# Patient Record
Sex: Male | Born: 1995 | Race: Black or African American | Hispanic: No | Marital: Single | Smoking: Former smoker
Health system: Southern US, Community
[De-identification: ages and names within clinical notes are randomized; demographics above are authoritative.]

---
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
## 2016-03-19 ENCOUNTER — Encounter (HOSPITAL_COMMUNITY): Payer: Self-pay

## 2016-03-19 ENCOUNTER — Emergency Department (HOSPITAL_COMMUNITY): Payer: Self-pay | Admitting: Anesthesiology

## 2016-03-19 ENCOUNTER — Encounter (HOSPITAL_COMMUNITY)
Admission: EM | Disposition: A | Discharge status: Home / Self Care | Payer: Self-pay | Source: Home / Self Care | Attending: Emergency Medicine

## 2016-03-19 ENCOUNTER — Ambulatory Visit (HOSPITAL_COMMUNITY)
Admission: EM | Admit: 2016-03-19 | Discharge: 2016-03-19 | Disposition: A | Discharge status: Home / Self Care | Payer: Self-pay | Attending: Emergency Medicine | Admitting: Emergency Medicine

## 2016-03-19 ENCOUNTER — Emergency Department (HOSPITAL_COMMUNITY): Payer: Self-pay

## 2016-03-19 DIAGNOSIS — N50819 Testicular pain, unspecified: Secondary | ICD-10-CM

## 2016-03-19 DIAGNOSIS — N4400 Torsion of testis, unspecified: Secondary | ICD-10-CM | POA: Insufficient documentation

## 2016-03-19 DIAGNOSIS — F17200 Nicotine dependence, unspecified, uncomplicated: Secondary | ICD-10-CM | POA: Insufficient documentation

## 2016-03-19 HISTORY — PX: ORCHIOPEXY: SHX479

## 2016-03-19 LAB — COMPREHENSIVE METABOLIC PANEL
ALBUMIN: 5.3 g/dL — AB (ref 3.5–5.0)
ALT: 19 U/L (ref 17–63)
ANION GAP: 10 (ref 5–15)
AST: 26 U/L (ref 15–41)
Alkaline Phosphatase: 44 U/L (ref 38–126)
BUN: 12 mg/dL (ref 6–20)
CO2: 24 mmol/L (ref 22–32)
Calcium: 9.8 mg/dL (ref 8.9–10.3)
Chloride: 106 mmol/L (ref 101–111)
Creatinine, Ser: 0.94 mg/dL (ref 0.61–1.24)
GFR calc Af Amer: >60 mL/min (ref >60)
GFR calc non Af Amer: >60 mL/min (ref >60)
GLUCOSE: 102 mg/dL — AB (ref 65–99)
POTASSIUM: 3.8 mmol/L (ref 3.5–5.1)
SODIUM: 140 mmol/L (ref 135–145)
Total Bilirubin: 1 mg/dL (ref 0.3–1.2)
Total Protein: 8.4 g/dL — ABNORMAL HIGH (ref 6.5–8.1)

## 2016-03-19 LAB — CBC
HEMATOCRIT: 44.8 % (ref 39.0–52.0)
HEMOGLOBIN: 16.1 g/dL (ref 13.0–17.0)
MCH: 30.7 pg (ref 26.0–34.0)
MCHC: 35.9 g/dL (ref 30.0–36.0)
MCV: 85.3 fL (ref 78.0–100.0)
Platelets: 182 10*3/uL (ref 150–400)
RBC: 5.25 MIL/uL (ref 4.22–5.81)
RDW: 12.2 % (ref 11.5–15.5)
WBC: 12.2 10*3/uL — ABNORMAL HIGH (ref 4.0–10.5)

## 2016-03-19 LAB — LIPASE, BLOOD: LIPASE: 18 U/L (ref 11–51)

## 2016-03-19 SURGERY — ORCHIOPEXY ADULT
Anesthesia: Choice | Laterality: Bilateral

## 2016-03-19 SURGERY — ORCHIOPEXY ADULT
Anesthesia: General | Site: Scrotum

## 2016-03-19 MED ORDER — FENTANYL CITRATE (PF) 100 MCG/2ML IJ SOLN
INTRAMUSCULAR | Status: AC
  Filled 2016-03-19: qty 2

## 2016-03-19 MED ORDER — MIDAZOLAM HCL 5 MG/5ML IJ SOLN
INTRAMUSCULAR | Status: DC | PRN
  Administered 2016-03-19: 2 mg via INTRAVENOUS

## 2016-03-19 MED ORDER — LIDOCAINE 2% (20 MG/ML) 5 ML SYRINGE
INTRAMUSCULAR | Status: DC | PRN
  Administered 2016-03-19: 50 mg via INTRAVENOUS

## 2016-03-19 MED ORDER — ONDANSETRON HCL 4 MG/2ML IJ SOLN
INTRAMUSCULAR | Status: AC
  Filled 2016-03-19: qty 2

## 2016-03-19 MED ORDER — ONDANSETRON HCL 4 MG/2ML IJ SOLN
INTRAMUSCULAR | Status: DC | PRN
Start: 2016-03-19 — End: 2016-03-19
  Administered 2016-03-19: 4 mg via INTRAVENOUS

## 2016-03-19 MED ORDER — 0.9 % SODIUM CHLORIDE (POUR BTL) OPTIME
TOPICAL | Status: DC | PRN
  Administered 2016-03-19: 1000 mL

## 2016-03-19 MED ORDER — NALOXONE HCL 0.4 MG/ML IJ SOLN
INTRAMUSCULAR | Status: AC
  Filled 2016-03-19: qty 1

## 2016-03-19 MED ORDER — ONDANSETRON 4 MG PO TBDP
4.0000 mg | ORAL_TABLET | Freq: Once | ORAL | Status: AC | PRN
  Administered 2016-03-19: 4 mg via ORAL
  Filled 2016-03-19: qty 1

## 2016-03-19 MED ORDER — BUPIVACAINE HCL (PF) 0.25 % IJ SOLN
INTRAMUSCULAR | Status: AC
  Filled 2016-03-19: qty 30

## 2016-03-19 MED ORDER — SODIUM CHLORIDE 0.9 % IV SOLN: 250.0000 mL | INTRAVENOUS | Status: DC | PRN

## 2016-03-19 MED ORDER — ONDANSETRON HCL 4 MG/2ML IJ SOLN
4.0000 mg | Freq: Once | INTRAMUSCULAR | Status: AC | PRN
  Administered 2016-03-19: 4 mg via INTRAVENOUS

## 2016-03-19 MED ORDER — ACETAMINOPHEN 650 MG RE SUPP
650.0000 mg | RECTAL | Status: DC | PRN
  Filled 2016-03-19: qty 1

## 2016-03-19 MED ORDER — BUPIVACAINE HCL (PF) 0.25 % IJ SOLN
INTRAMUSCULAR | Status: DC | PRN
  Administered 2016-03-19: 16 mL

## 2016-03-19 MED ORDER — SUCCINYLCHOLINE CHLORIDE 200 MG/10ML IV SOSY
PREFILLED_SYRINGE | INTRAVENOUS | Status: DC | PRN
  Administered 2016-03-19: 100 mg via INTRAVENOUS

## 2016-03-19 MED ORDER — SODIUM CHLORIDE 0.9% FLUSH: 3.0000 mL | Freq: Two times a day (BID) | INTRAVENOUS | Status: DC

## 2016-03-19 MED ORDER — CEFAZOLIN SODIUM-DEXTROSE 2-4 GM/100ML-% IV SOLN: 2.0000 g | INTRAVENOUS | Status: DC

## 2016-03-19 MED ORDER — HYDROMORPHONE HCL 1 MG/ML IJ SOLN: 0.2500 mg | INTRAMUSCULAR | Status: DC | PRN

## 2016-03-19 MED ORDER — ACETAMINOPHEN 325 MG PO TABS: 650.0000 mg | ORAL_TABLET | ORAL | Status: DC | PRN

## 2016-03-19 MED ORDER — CEFAZOLIN SODIUM-DEXTROSE 2-4 GM/100ML-% IV SOLN
INTRAVENOUS | Status: AC
  Filled 2016-03-19: qty 100

## 2016-03-19 MED ORDER — PROPOFOL 10 MG/ML IV BOLUS
INTRAVENOUS | Status: AC
  Filled 2016-03-19: qty 20

## 2016-03-19 MED ORDER — NALOXONE HCL 0.4 MG/ML IJ SOLN
INTRAMUSCULAR | Status: DC | PRN
  Administered 2016-03-19 (×2): .1 ug via INTRAVENOUS

## 2016-03-19 MED ORDER — OXYCODONE HCL 5 MG PO TABS: 5.0000 mg | ORAL_TABLET | ORAL | Status: DC | PRN

## 2016-03-19 MED ORDER — MORPHINE SULFATE (PF) 2 MG/ML IV SOLN
4.0000 mg | Freq: Once | INTRAVENOUS | Status: AC
  Administered 2016-03-19: 4 mg via INTRAVENOUS
  Filled 2016-03-19: qty 2

## 2016-03-19 MED ORDER — MEPERIDINE HCL 50 MG/ML IJ SOLN: 6.2500 mg | INTRAMUSCULAR | Status: DC | PRN

## 2016-03-19 MED ORDER — SODIUM CHLORIDE 0.9% FLUSH: 3.0000 mL | INTRAVENOUS | Status: DC | PRN

## 2016-03-19 MED ORDER — LIDOCAINE 2% (20 MG/ML) 5 ML SYRINGE
INTRAMUSCULAR | Status: AC
  Filled 2016-03-19: qty 5

## 2016-03-19 MED ORDER — LACTATED RINGERS IV SOLN
INTRAVENOUS | Status: DC | PRN
  Administered 2016-03-19: 21:00:00 via INTRAVENOUS

## 2016-03-19 MED ORDER — LACTATED RINGERS IV SOLN: INTRAVENOUS | Status: DC

## 2016-03-19 MED ORDER — CEFAZOLIN SODIUM-DEXTROSE 2-3 GM-% IV SOLR
INTRAVENOUS | Status: DC | PRN
  Administered 2016-03-19: 2 g via INTRAVENOUS

## 2016-03-19 MED ORDER — PROPOFOL 10 MG/ML IV BOLUS
INTRAVENOUS | Status: DC | PRN
Start: 2016-03-19 — End: 2016-03-19
  Administered 2016-03-19: 150 mg via INTRAVENOUS

## 2016-03-19 MED ORDER — MIDAZOLAM HCL 2 MG/2ML IJ SOLN
INTRAMUSCULAR | Status: AC
  Filled 2016-03-19: qty 2

## 2016-03-19 MED ORDER — HYDROCODONE-ACETAMINOPHEN 5-325 MG PO TABS: 1.0000 | ORAL_TABLET | Freq: Four times a day (QID) | ORAL | 0 refills | Status: AC | PRN

## 2016-03-19 MED ORDER — ONDANSETRON HCL 4 MG/2ML IJ SOLN: 4.0000 mg | Freq: Once | INTRAMUSCULAR | Status: DC | PRN

## 2016-03-19 MED ORDER — FENTANYL CITRATE (PF) 100 MCG/2ML IJ SOLN
INTRAMUSCULAR | Status: DC | PRN
  Administered 2016-03-19: 25 ug via INTRAVENOUS
  Administered 2016-03-19: 100 ug via INTRAVENOUS

## 2016-03-19 MED ORDER — FENTANYL CITRATE (PF) 100 MCG/2ML IJ SOLN: 25.0000 ug | INTRAMUSCULAR | Status: DC | PRN

## 2016-03-19 MED ORDER — ONDANSETRON HCL 4 MG/2ML IJ SOLN
4.0000 mg | Freq: Once | INTRAMUSCULAR | Status: AC
  Administered 2016-03-19: 4 mg via INTRAVENOUS
  Filled 2016-03-19: qty 2

## 2016-03-19 SURGICAL SUPPLY — 30 items
BLADE HEX COATED 2.75 (ELECTRODE) ×4 IMPLANT
BLADE SURG 15 STRL LF DISP TIS (BLADE) ×2 IMPLANT
BLADE SURG 15 STRL SS (BLADE) ×2
CATH URET 5FR 28IN OPEN ENDED (CATHETERS) IMPLANT
DERMABOND ADVANCED (GAUZE/BANDAGES/DRESSINGS) ×2
DERMABOND ADVANCED .7 DNX12 (GAUZE/BANDAGES/DRESSINGS) ×2 IMPLANT
DRAIN PENROSE 18X1/2 LTX STRL (DRAIN) ×4 IMPLANT
DRSG KUZMA FLUFF (GAUZE/BANDAGES/DRESSINGS) ×4 IMPLANT
ELECT PENCIL ROCKER SW 15FT (MISCELLANEOUS) ×4 IMPLANT
ELECT REM PT RETURN 9FT ADLT (ELECTROSURGICAL) ×4
ELECTRODE REM PT RTRN 9FT ADLT (ELECTROSURGICAL) ×2 IMPLANT
GLOVE BIOGEL PI IND STRL 6.5 (GLOVE) ×2 IMPLANT
GLOVE BIOGEL PI INDICATOR 6.5 (GLOVE) ×2
GLOVE SURG SS PI 6.5 STRL IVOR (GLOVE) ×4 IMPLANT
GLOVE SURG SS PI 8.0 STRL IVOR (GLOVE) ×4 IMPLANT
GOWN STRL REUS W/ TWL LRG LVL3 (GOWN DISPOSABLE) ×2 IMPLANT
GOWN STRL REUS W/TWL LRG LVL3 (GOWN DISPOSABLE) ×2
GOWN STRL REUS W/TWL XL LVL3 (GOWN DISPOSABLE) ×4 IMPLANT
KIT BASIN OR (CUSTOM PROCEDURE TRAY) ×4 IMPLANT
NEEDLE HYPO 22GX1.5 SAFETY (NEEDLE) ×4 IMPLANT
PACK BASIC VI WITH GOWN DISP (CUSTOM PROCEDURE TRAY) ×4 IMPLANT
SPONGE LAP 4X18 X RAY DECT (DISPOSABLE) ×8 IMPLANT
SUPPORTER ATHLETIC SM (MISCELLANEOUS) ×4 IMPLANT
SUT CHROMIC 3 0 SH 27 (SUTURE) ×8 IMPLANT
SUT CHROMIC 4 0 SH 27 (SUTURE) IMPLANT
SUT SILK 3 0 SH CR/8 (SUTURE) ×4 IMPLANT
SUT VIC AB 3-0 SH 27 (SUTURE) ×2
SUT VIC AB 3-0 SH 27XBRD (SUTURE) ×2 IMPLANT
SUT VICRYL 0 TIES 12 18 (SUTURE) ×4 IMPLANT
SYR CONTROL 10ML LL (SYRINGE) ×4 IMPLANT

## 2016-03-19 NOTE — ED Notes (Signed)
US at bedside

## 2016-03-19 NOTE — Anesthesia Procedure Notes (Signed)
Procedure Name: Intubation Date/Time: 03/19/2016 9:06 PM Performed by: Paris LoreBLANTON, Dhalia Zingaro M Pre-anesthesia Checklist: Patient identified, Emergency Drugs available, Suction available, Patient being monitored and Timeout performed Patient Re-evaluated:Patient Re-evaluated prior to inductionOxygen Delivery Method: Circle system utilized Preoxygenation: Pre-oxygenation with 100% oxygen Intubation Type: IV induction, Cricoid Pressure applied and Rapid sequence Laryngoscope Size: Mac and 4 Grade View: Grade I Tube type: Oral Tube size: 7.5 mm Number of attempts: 1 Airway Equipment and Method: Stylet Placement Confirmation: ETT inserted through vocal cords under direct vision,  positive ETCO2,  CO2 detector and breath sounds checked- equal and bilateral Secured at: 21 cm Tube secured with: Tape Dental Injury: Teeth and Oropharynx as per pre-operative assessment

## 2016-03-19 NOTE — Consult Note (Signed)
Subjective: CC: left testicular pain.  Hx: I was asked to see Jeremy Adkins in consultation by Dr. Preston FleetingGlick for left testicular torsion.  Jeremy Adkins had the onset of left testicular pain at 11am.  The pain has been severe with nausea and vomiting.   Jeremy Adkins has not voided in the ER but has no voiding complaints.   US confirms torsion.   Jeremy Adkins has no other GU history and no associated signs or symptoms.  ROS:  Review of Systems  Constitutional: Negative for chills and fever.  Gastrointestinal: Positive for nausea and vomiting.  All other systems reviewed and are negative.   No Known Allergies  History reviewed. No pertinent past medical history.  History reviewed. No pertinent surgical history.  Social History   Social History  . Marital status: Single    Spouse name: N/A  . Number of children: N/A  . Years of education: N/A   Occupational History  . Not on file.   Social History Main Topics  . Smoking status: Current Some Day Smoker  . Smokeless tobacco: Never Used  . Alcohol use Yes     Comment: occ  . Drug use:     Types: Marijuana  . Sexual activity: No   Other Topics Concern  . Not on file   Social History Narrative  . No narrative on file    History reviewed. No pertinent family history.  Anti-infectives: Anti-infectives    None      No current facility-administered medications for this encounter.    Current Outpatient Prescriptions  Medication Sig Dispense Refill  . calcium carbonate (TUMS - DOSED IN MG ELEMENTAL CALCIUM) 500 MG chewable tablet Chew 2 tablets by mouth 3 (three) times daily as needed for indigestion.     Marland Kitchen. ibuprofen (ADVIL,MOTRIN) 200 MG tablet Take 400 mg by mouth every 6 (six) hours as needed. For pain     Past med, surg,fam and social history reviewed.   Objective: Vital signs in last 24 hours: Temp:  [97.6 F (36.4 C)] 97.6 F (36.4 C) (11/03 1718) Pulse Rate:  [51] 51 (11/03 1718) Resp:  [18] 18 (11/03 1718) BP: (115)/(90) 115/90 (11/03  1718) SpO2:  [98 %] 98 % (11/03 1718)  Intake/Output from previous day: No intake/output data recorded. Intake/Output this shift: No intake/output data recorded.   Physical Exam  Constitutional: Jeremy Adkins is oriented to person, place, and time and well-developed, well-nourished, and in no distress.  HENT:  Head: Normocephalic and atraumatic.  Neck: Normal range of motion. Neck supple. No thyromegaly present.  Cardiovascular: Normal rate, regular rhythm and normal heart sounds.   Pulmonary/Chest: Effort normal and breath sounds normal. No respiratory distress.  Abdominal: Soft. Jeremy Adkins exhibits no distension and no mass. There is no tenderness.  Genitourinary:  Genitourinary Comments: Nl penis with adequate meatus.  Scrotum with normal skin without edema.  Left testicle is high riding and tender without swelling.  Right testicle is normal.     Epididymii are unremarkable.   Musculoskeletal: Normal range of motion. Jeremy Adkins exhibits no edema or tenderness.  Lymphadenopathy:    Jeremy Adkins has no cervical adenopathy.  Neurological: Jeremy Adkins is alert and oriented to person, place, and time.  Skin: Skin is warm and dry.  Psychiatric: Mood and affect normal.  Vitals reviewed.   Lab Results:   Recent Labs  03/19/16 1906  WBC 12.2*  HGB 16.1  HCT 44.8  PLT 182   BMET  Recent Labs  03/19/16 1906  NA 140  K 3.8  CL  106  CO2 24  GLUCOSE 102*  BUN 12  CREATININE 0.94  CALCIUM 9.8   PT/INR No results for input(s): LABPROT, INR in the last 72 hours. ABG No results for input(s): PHART, HCO3 in the last 72 hours.  Invalid input(s): PCO2, PO2  Studies/Results: US Scrotum  Result Date: 03/19/2016 CLINICAL DATA:  Acute onset left testicular pain several hours ago. EXAM: SCROTAL ULTRASOUND DOPPLER ULTRASOUND OF THE TESTICLES TECHNIQUE: Complete ultrasound examination of the testicles, epididymis, and other scrotal structures was performed. Color and spectral Doppler ultrasound were also utilized to  evaluate blood flow to the testicles. COMPARISON:  None. FINDINGS: Right testicle Measurements: 4.5 x 2.5 x 3.0 cm. No mass or microlithiasis visualized. Internal blood flow seen on color Doppler ultrasound. Left testicle Measurements: 4.2 x 2.5 x 3.7 cm. Diffusely heterogeneous decreased echotexture, without evidence of focal mass. Absence of internal blood flow seen on color Doppler ultrasound. Right epididymis:  Normal in size and appearance. Left epididymis: Enlarged left epididymis with mild hyperemia on color Doppler ultrasound. Hydrocele:  None visualized. Varicocele:  None visualized. Pulsed Doppler interrogation of both testes demonstrates normal low resistance arterial and venous waveforms within the right testicle, however no arterial or venous waveforms could be obtained from the right testicle. IMPRESSION: Absence of blood flow in diffusely decreased heterogeneous echotexture of left testicle, consistent with left testicular torsion. Critical Value/emergent results were called by telephone at the time of interpretation on 03/19/2016 at 8:13 pm to Dr. Preston Fleeting in the ED, who verbally acknowledged these results. Electronically Signed   By: Myles Rosenthal M.D.   On: 03/19/2016 20:15   [place] Art/ven Flow Abd Pelv Doppler  Result Date: 03/19/2016 CLINICAL DATA:  Acute onset left testicular pain several hours ago. EXAM: SCROTAL ULTRASOUND DOPPLER ULTRASOUND OF THE TESTICLES TECHNIQUE: Complete ultrasound examination of the testicles, epididymis, and other scrotal structures was performed. Color and spectral Doppler ultrasound were also utilized to evaluate blood flow to the testicles. COMPARISON:  None. FINDINGS: Right testicle Measurements: 4.5 x 2.5 x 3.0 cm. No mass or microlithiasis visualized. Internal blood flow seen on color Doppler ultrasound. Left testicle Measurements: 4.2 x 2.5 x 3.7 cm. Diffusely heterogeneous decreased echotexture, without evidence of focal mass. Absence of internal blood flow seen on  color Doppler ultrasound. Right epididymis:  Normal in size and appearance. Left epididymis: Enlarged left epididymis with mild hyperemia on color Doppler ultrasound. Hydrocele:  None visualized. Varicocele:  None visualized. Pulsed Doppler interrogation of both testes demonstrates normal low resistance arterial and venous waveforms within the right testicle, however no arterial or venous waveforms could be obtained from the right testicle. IMPRESSION: Absence of blood flow in diffusely decreased heterogeneous echotexture of left testicle, consistent with left testicular torsion. Critical Value/emergent results were called by telephone at the time of interpretation on 03/19/2016 at 8:13 pm to Dr. Preston Fleeting in the ED, who verbally acknowledged these results. Electronically Signed   By: Myles Rosenthal M.D.   On: 03/19/2016 20:15   Labs and [place] report reviewed.   I discussed the case with Chase Picket PA for Dr. Preston Fleeting    Assessment: Left testicular torsion 9-10 hours in duration.  I am going to take him to the OR emergently for scrotal exploration with left orchiopexy vs orchiectomy and right orchiectomy.  I have reviewed the risks of bleeding, infection, loss of the testicle, atrophy of the testicle, impaired fertility, impaired hormone production, wound complications, pain, thrombotic events and anesthetic complications.      CC:  Dr. Dione Boozeavid Glick.      Grant Henkes J 03/19/2016 [phone]

## 2016-03-19 NOTE — ED Triage Notes (Signed)
Pt presents from home c/o N/V and chills that started at 10AM this morning. PT also endorses dizziness and congestion. Pt unable to tolerate PO. Ambulatory. A&Ox4.

## 2016-03-19 NOTE — Brief Op Note (Signed)
03/19/2016  9:41 PM  PATIENT:  Jeremy Adkins  20 y.o. male  PRE-OPERATIVE DIAGNOSIS:  testicular torsion  POST-OPERATIVE DIAGNOSIS:  testicular torsion  PROCEDURE:  Procedure(s): SCROTAL EXPLORATION, LEFT ORCHIOPEXY  AND RIGHT ORCHIOPEXY (N/A)  SURGEON:  Surgeon(s) and Role:    * Bjorn PippinJohn Damika Harmon, MD - Primary  PHYSICIAN ASSISTANT:   ASSISTANTS: none   ANESTHESIA:   general  EBL:  Total I/O In: -  Out: 10 [Blood:10]  BLOOD ADMINISTERED:none  DRAINS: none   LOCAL MEDICATIONS USED:  MARCAINE    and Amount: 12 ml 0.25%  SPECIMEN:  No Specimen  DISPOSITION OF SPECIMEN:  N/A  COUNTS:  YES  TOURNIQUET:  * No tourniquets in log *  DICTATION: .Other Dictation: Dictation Number [id number]  PLAN OF CARE: Discharge to home after PACU  PATIENT DISPOSITION:  PACU - hemodynamically stable.   Delay start of Pharmacological VTE agent (>24hrs) due to surgical blood loss or risk of bleeding: not applicable

## 2016-03-19 NOTE — ED Notes (Signed)
Patient taken to OR.

## 2016-03-19 NOTE — Discharge Instructions (Addendum)
HOME CARE INSTRUCTIONS FOR SCROTAL PROCEDURES  Wound Care & Hygiene: You may apply an ice bag to the scrotum for the first 24 hours.  This may help decrease swelling and soreness.  You may have a dressing held in place by an athletic supporter.  You may remove the dressing in 24 hours and shower in 48 hours.  Continue to use the athletic supporter or tight briefs for at least a week. Activity: Rest today - not necessarily flat bed rest.  Just take it easy.  You should not do strenuous activities until your follow-up visit with your doctor.  You may resume light activity in 48 hours.  Return to Work:  Your doctor will advise you of this depending on the type of work you do  Diet: Drink liquids or eat a light diet this evening.  You may resume a regular diet tomorrow.  General Expectations: You may have a small amount of bleeding.  The scrotum may be swollen or bruised for about a week.  Call your Doctor if these occur:  -persistent or heavy bleeding  -temperature of 101 degrees or more  -severe pain, not relieved by your pain medication  [facility]  Call to set up and appointment. For 1-2 weeks.   [phone]  Patient Signature:  __________________________________________________  Nurse's Signature:  __________________________________________________ Testicular Torsion Testicular torsion is a twisting of the spermatic cord, artery, and vein that go to the testicle. This twisting cuts off the blood supply to everything in the sac that contains the testes, blood vessels, and part of the spermatic cord (scrotum). Testicular torsion is most commonly seen in newborn and adolescent males. It can also occur before birth. Testicular torsion requires emergency treatment. The testicle usually can be saved if the torsion is treated within 6 hours of onset. If the torsion is left untreated for too long, the testicle will die and have to be removed.  CAUSES  Torsion can be caused  by a hit on the scrotum or by certain movements during exercise. In some males, testicular torsion is more common because the connection of their testicle to a specific tissue in their scrotum developed in the wrong place, allowing the testicle to rotate and the cord to get twisted.  SIGNS AND SYMPTOMS  The main symptom of testicular torsion is pain in your testicle. The scrotum may be swollen, red, hard, and very tender. There will be excess fluid in the tissue (edema).The testicle may be higher than normal in the scrotum. The skin of the scrotum may be stuck to the testicle. You may have nausea, vomiting, and a fever. DIAGNOSIS  Often testicular torsion is diagnosed through a physical exam. Sometimes imaging exams and tests to measure blood flow may be done. TREATMENT  A manual untwisting of the testicle may be done when the testicle is still mobile and the maneuver is not too painful. However, surgery usually is necessary and should be done as soon as possible after torsion occurs. During surgery, the testicle is untwisted and evaluated and possibly removed.    This information is not intended to replace advice given to you by your health care provider. Make sure you discuss any questions you have with your health care provider.   Document Released: 05/03/2005 Document Revised: 05/08/2013 Document Reviewed: 10/23/2012 Elsevier Interactive Patient Education [employer]2016 [employer] Inc.

## 2016-03-19 NOTE — Anesthesia Postprocedure Evaluation (Signed)
Anesthesia Post Note  Patient: Jeremy Adkins  Procedure(s) Performed: Procedure(s) (LRB): SCROTAL EXPLORATION AND BILATERAL ORCHIDOPEXY (Bilateral)  Patient location during evaluation: PACU Anesthesia Type: General Level of consciousness: awake and alert Pain management: pain level controlled Vital Signs Assessment: post-procedure vital signs reviewed and stable Respiratory status: spontaneous breathing, nonlabored ventilation, respiratory function stable and patient connected to nasal cannula oxygen Cardiovascular status: blood pressure returned to baseline and stable Postop Assessment: no signs of nausea or vomiting Anesthetic complications: no    Last Vitals:  Vitals:   03/19/16 2221 03/19/16 2230  BP: 123/76 136/76  Pulse: (!) 43 (!) 42  Resp: (!) 21 16  Temp:      Last Pain:  Vitals:   03/19/16 2209  TempSrc:   PainSc: 0-No pain                 Iley Breeden DAVID

## 2016-03-19 NOTE — ED Notes (Signed)
US called and stated she is at [facility]: [facility].  Someone will be here at 1900 to perform [place]

## 2016-03-19 NOTE — Anesthesia Preprocedure Evaluation (Signed)
Anesthesia Evaluation  Patient identified by MRN, date of birth, ID band Patient awake    Reviewed: Allergy & Precautions, NPO status , Patient's Chart, lab work & pertinent test results  Airway Mallampati: I  TM Distance: >3 FB Neck ROM: Full    Dental   Pulmonary Current Smoker,    Pulmonary exam normal        Cardiovascular Normal cardiovascular exam     Neuro/Psych    GI/Hepatic   Endo/Other    Renal/GU      Musculoskeletal   Abdominal   Peds  Hematology   Anesthesia Other Findings   Reproductive/Obstetrics                             Anesthesia Physical Anesthesia Plan  ASA: II and emergent  Anesthesia Plan: General   Post-op Pain Management:    Induction: Intravenous, Rapid sequence and Cricoid pressure planned  Airway Management Planned: Oral ETT  Additional Equipment:   Intra-op Plan:   Post-operative Plan: Extubation in OR  Informed Consent: I have reviewed the patients History and Physical, chart, labs and discussed the procedure including the risks, benefits and alternatives for the proposed anesthesia with the patient or authorized representative who has indicated his/her understanding and acceptance.     Plan Discussed with: CRNA and Surgeon  Anesthesia Plan Comments:         Anesthesia Quick Evaluation  

## 2016-03-19 NOTE — ED Notes (Addendum)
Patient's mother approached me in lobby as I called a patient back from waiting room with concern that her son was in a lot of pain.  Upon reassessment, patient denies pain with abdominal palpation.  He states his left scrotum is hurting and began to hurt around 11 am today.  He did not mention that initially because he was embarassed to discuss it in front of his mother.  Patient was immediately roomed in [facility] and EDP Pilcher notified for concern for torsion.

## 2016-03-19 NOTE — ED Provider Notes (Signed)
[facility]-EMERGENCY DEPT Provider Note   CSN: [id number] Arrival date & time: 03/19/16  1712     History   Chief Complaint Chief Complaint  Patient presents with  . Emesis  . Dizziness    HPI Jeremy Adkins is a 20 y.o. male.  The history is provided by the patient and medical records. No language interpreter was used.  Emesis   Pertinent negatives include no abdominal pain, no fever and no headaches.  Dizziness  Associated symptoms: nausea and vomiting   Associated symptoms: no blood in stool, no chest pain, no headaches and no shortness of breath    Jeremy Adkins is an otherwise healthy 20 y.o. male  who presents to the Emergency Department complaining of acute onset of left testicular pain beginning approximately 11:30 this morning. Associated symptoms include testicular swelling and tenderness. Patient states immediately following pain, he became nauseous and has had multiple episodes of emesis. He denies abdominal pain, fever, penile discharge/pain, dysuria, back pain, chest pain, shortness of breath. Mother states she tried to give him Pepto-Bismol because he told her his stomach was upset, but he immediately threw this up. No medications taken prior to arrival for pain. Last time he ate or drank anything other than Pepto-Bismol was last night.  History reviewed. No pertinent past medical history.  There are no active problems to display for this patient.   History reviewed. No pertinent surgical history.     Home Medications    Prior to Admission medications   Medication Sig Start Date End Date Taking? Authorizing Provider  calcium carbonate (TUMS - DOSED IN MG ELEMENTAL CALCIUM) 500 MG chewable tablet Chew 2 tablets by mouth 3 (three) times daily as needed for indigestion.    Yes Historical Provider, MD  ibuprofen (ADVIL,MOTRIN) 200 MG tablet Take 400 mg by mouth every 6 (six) hours as needed. For pain   Yes Historical Provider, MD    Family History History reviewed.  No pertinent family history.  Social History Social History  Substance Use Topics  . Smoking status: Current Some Day Smoker  . Smokeless tobacco: Never Used  . Alcohol use Yes     Comment: occ     Allergies   Review of patient's allergies indicates no known allergies.   Review of Systems Review of Systems  Constitutional: Negative for fever.  HENT: Negative for congestion.   Respiratory: Negative for shortness of breath.   Cardiovascular: Negative for chest pain.  Gastrointestinal: Positive for nausea and vomiting. Negative for abdominal pain and blood in stool.  Genitourinary: Positive for scrotal swelling and testicular pain. Negative for discharge, dysuria, penile pain and penile swelling.  Musculoskeletal: Negative for back pain.  Skin: Negative for color change.  Neurological: Negative for headaches.  Hematological: Does not bruise/bleed easily.     Physical Exam Updated Vital Signs BP 115/90 (BP Location: Right Arm)   Pulse (!) 51   Temp 97.6 F (36.4 C) (Oral)   Resp 18   SpO2 98%   Physical Exam  Constitutional: He is oriented to person, place, and time. He appears well-developed and well-nourished. No distress.  HENT:  Head: Normocephalic and atraumatic.  Cardiovascular:  No murmur heard. Bradycardic but regular.  Pulmonary/Chest: Effort normal and breath sounds normal. No respiratory distress.  Abdominal: Soft. Bowel sounds are normal. He exhibits no distension. There is no tenderness.  Genitourinary:  Genitourinary Comments: Chaperone present for exam. High riding left testicle which is firm and very tender to the touch. No  erythema or warmth.   Neurological: He is alert and oriented to person, place, and time.  Skin: Skin is warm and dry.  Nursing note and vitals reviewed.   ED Treatments / Results  Labs (all labs ordered are listed, but only abnormal results are displayed) Labs Reviewed  COMPREHENSIVE METABOLIC PANEL - Abnormal; Notable for  the following:       Result Value   Glucose, Bld 102 (*)    Total Protein 8.4 (*)    Albumin 5.3 (*)    All other components within normal limits  CBC - Abnormal; Notable for the following:    WBC 12.2 (*)    All other components within normal limits  LIPASE, BLOOD  URINALYSIS, ROUTINE W REFLEX MICROSCOPIC ([facility])    EKG  EKG Interpretation None       Radiology Jeremy Adkins  Result Date: 03/19/2016 CLINICAL DATA:  Acute onset left testicular pain several hours ago. EXAM: SCROTAL ULTRASOUND DOPPLER ULTRASOUND OF THE TESTICLES TECHNIQUE: Complete ultrasound examination of the testicles, epididymis, and other scrotal structures was performed. Color and spectral Doppler ultrasound were also utilized to evaluate blood flow to the testicles. COMPARISON:  None. FINDINGS: Right testicle Measurements: 4.5 x 2.5 x 3.0 cm. No mass or microlithiasis visualized. Internal blood flow seen on color Doppler ultrasound. Left testicle Measurements: 4.2 x 2.5 x 3.7 cm. Diffusely heterogeneous decreased echotexture, without evidence of focal mass. Absence of internal blood flow seen on color Doppler ultrasound. Right epididymis:  Normal in size and appearance. Left epididymis: Enlarged left epididymis with mild hyperemia on color Doppler ultrasound. Hydrocele:  None visualized. Varicocele:  None visualized. Pulsed Doppler interrogation of both testes demonstrates normal low resistance arterial and venous waveforms within the right testicle, however no arterial or venous waveforms could be obtained from the right testicle. IMPRESSION: Absence of blood flow in diffusely decreased heterogeneous echotexture of left testicle, consistent with left testicular torsion. Critical Value/emergent results were called by telephone at the time of interpretation on 03/19/2016 at 8:13 pm to Dr. Preston FleetingGlick in the ED, who verbally acknowledged these results. Electronically Signed   By: Myles RosenthalJohn  Stahl M.D.   On: 03/19/2016 20:15   Jeremy  Art/ven Flow Abd Pelv Doppler  Result Date: 03/19/2016 CLINICAL DATA:  Acute onset left testicular pain several hours ago. EXAM: SCROTAL ULTRASOUND DOPPLER ULTRASOUND OF THE TESTICLES TECHNIQUE: Complete ultrasound examination of the testicles, epididymis, and other scrotal structures was performed. Color and spectral Doppler ultrasound were also utilized to evaluate blood flow to the testicles. COMPARISON:  None. FINDINGS: Right testicle Measurements: 4.5 x 2.5 x 3.0 cm. No mass or microlithiasis visualized. Internal blood flow seen on color Doppler ultrasound. Left testicle Measurements: 4.2 x 2.5 x 3.7 cm. Diffusely heterogeneous decreased echotexture, without evidence of focal mass. Absence of internal blood flow seen on color Doppler ultrasound. Right epididymis:  Normal in size and appearance. Left epididymis: Enlarged left epididymis with mild hyperemia on color Doppler ultrasound. Hydrocele:  None visualized. Varicocele:  None visualized. Pulsed Doppler interrogation of both testes demonstrates normal low resistance arterial and venous waveforms within the right testicle, however no arterial or venous waveforms could be obtained from the right testicle. IMPRESSION: Absence of blood flow in diffusely decreased heterogeneous echotexture of left testicle, consistent with left testicular torsion. Critical Value/emergent results were called by telephone at the time of interpretation on 03/19/2016 at 8:13 pm to Dr. Preston FleetingGlick in the ED, who verbally acknowledged these results. Electronically Signed   By: Jonny RuizJohn  Eppie Gibson M.D.   On: 03/19/2016 20:15    Procedures Procedures (including critical care time)  Medications Ordered in ED Medications  ondansetron (ZOFRAN-ODT) disintegrating tablet 4 mg (4 mg Oral Given 03/19/16 1732)  morphine 2 MG/ML injection 4 mg (4 mg Intravenous Given 03/19/16 1859)  ondansetron (ZOFRAN) injection 4 mg (4 mg Intravenous Given 03/19/16 1859)     Initial Impression / Assessment and  Plan / ED Course  I have reviewed the triage vital signs and the nursing notes.  Pertinent labs & imaging results that were available during my care of the patient were reviewed by me and considered in my medical decision making (see chart for details).  Clinical Course   Jeremy Adkins is a 20 y.o. male who presents to ED for acute onset of left testicular pain that began approximately 11:30 AM this morning. On exam, patient has a high riding, firm and tender left testicle concerning for torsion. Labs and ultrasound ordered. Ultrasound confirms absence of blood flow to the left testicle consistent with torsion.   8:08 PM - Consulted urology, Dr. Annabell Howells who will take patient to the operating room tonight.  Discussed the ultrasound findings with patient and informed him that urology will be seeing him shortly for procedure.   Patient seen by and discussed with Dr. Preston Fleeting who agrees with treatment plan.   Final Clinical Impressions(s) / ED Diagnoses   Final diagnoses:  Testicular pain  Testicular pain    New Prescriptions Current Discharge Medication List       [facility] Ward, PA-C 03/19/16 2116    Dione Booze, MD 03/20/16 [phone]

## 2016-03-19 NOTE — Transfer of Care (Signed)
Immediate Anesthesia Transfer of Care Note  Patient: Jeremy Adkins  Procedure(s) Performed: Procedure(s): SCROTAL EXPLORATION AND BILATERAL ORCHIDOPEXY (Bilateral)  Patient Location: PACU  Anesthesia Type:General  Level of Consciousness:  sedated, patient cooperative and responds to stimulation  Airway & Oxygen Therapy:Patient Spontanous Breathing and Patient connected to face mask oxgen  Post-op Assessment:  Report given to PACU RN and Post -op Vital signs reviewed and stable  Post vital signs:  Reviewed and stable  Last Vitals:  Vitals:   03/19/16 1718 03/19/16 2209  BP: 115/90   Pulse: (!) 51 (!) 51  Resp: 18 (!) 21  Temp: 36.4 C (P) 36.7 C    Complications: No apparent anesthesia complications

## 2016-03-20 NOTE — Op Note (Signed)
NAMCharmian Muff:  Blumer, Leverett                 ACCOUNT NO.:  [id number]  MEDICAL RECORD NO.:  [id number]  LOCATION:  [facility]                         [facility]  PHYSICIAN:  Excell SeltzerJohn J. Annabell HowellsWrenn, M.D.    DATE OF BIRTH:  1995-11-23  DATE OF PROCEDURE:  03/19/2016 DATE OF DISCHARGE:  03/19/2016                              OPERATIVE REPORT   PROCEDURE:  Scrotal exploration with bilateral orchidopexy.  PREOPERATIVE DIAGNOSIS:  Left testicular torsion.  POSTOPERATIVE DIAGNOSIS:  Left testicular torsion.  SURGEON:  Excell SeltzerJohn J. Annabell HowellsWrenn, M.D.  ANESTHESIA:  General.  SPECIMEN:  None.  DRAINS:  None.  BLOOD LOSS:  Minimal.  COMPLICATIONS:  None.  INDICATIONS:  Jeremy Adkins is a 20 year old male who had the onset at 11 o'clock today of severe left testicular pain with nausea and vomiting. He was delayed in get him to the emergency room, but once he arrived, an ultrasound demonstrated no blood flow to the left testicle consistent with testicular torsion.  He was brought urgently to the operating room for exploration, possibly the torsion with orchiopexy versus orchidectomy.  FINDINGS AND PROCEDURE:  He was given 2 g of Ancef, a general anesthetic was induced.  He was placed in the supine position.  His genitalia was clipped.  He was prepped with Betadine solution and draped in usual sterile fashion.  A midline anterior scrotal incision was made and the left testicle was delivered from the wound.  The tunica vaginalis was opened and the testicle was exposed.  The testicle was torsed.  It was detorsed easily and laid to the side.  I then delivered the right testicle in identical fashion.  This testicle had a very redundant appendix epididymis, which was removed with great care using the Bovie.  This testicle was then pexed into a dartos pouch using triangulated 3-0 chromic sutures.  I then looked back at the left testicle, it had pinked up nicely.  There were still some dusky areas and it was a bit tight, but  it was felt that orchiectomy was not indicated.  The left testicle was then pexed into subdartos pouch using triangulated 3-0 silk sutures.  Cord blocks with 0.25% Marcaine were performed and eventually, the skin incision was infiltrated as well.  Total of 12 mL of 0.25% Marcaine was used.  Once the testicles had been pexed and hemostasis was assured, the dartos was closed using a running 3-0 chromic suture with care being taken to include the median raphe.  The skin was then closed using running vertical mattress suture.  The wound was then reinforced using Dermabond.  After the Dermabond dried, the surrounding structures had been cleansed, a dressing of 4x4s, fluff, Kerlix and a scrotal support was applied.  The patient's anesthetic was reversed.  He was moved to the recovery room in stable condition.  There were no complications.  He will be discharged home with prescription for pain medication.     Excell SeltzerJohn J. Annabell HowellsWrenn, M.D.     JJW/MEDQ  D:  03/19/2016  T:  03/20/2016  Job:  [id number]

## 2016-03-22 ENCOUNTER — Encounter (HOSPITAL_COMMUNITY): Payer: Self-pay | Admitting: Urology

## 2016-12-16 ENCOUNTER — Encounter (HOSPITAL_COMMUNITY): Payer: Self-pay | Admitting: Emergency Medicine

## 2016-12-16 ENCOUNTER — Emergency Department (HOSPITAL_COMMUNITY)
Admission: EM | Admit: 2016-12-16 | Discharge: 2016-12-16 | Disposition: A | Discharge status: Home / Self Care | Payer: Self-pay | Attending: Emergency Medicine | Admitting: Emergency Medicine

## 2016-12-16 DIAGNOSIS — X58XXXA Exposure to other specified factors, initial encounter: Secondary | ICD-10-CM | POA: Insufficient documentation

## 2016-12-16 DIAGNOSIS — F17210 Nicotine dependence, cigarettes, uncomplicated: Secondary | ICD-10-CM | POA: Insufficient documentation

## 2016-12-16 DIAGNOSIS — Y929 Unspecified place or not applicable: Secondary | ICD-10-CM | POA: Insufficient documentation

## 2016-12-16 DIAGNOSIS — S30812A Abrasion of penis, initial encounter: Secondary | ICD-10-CM | POA: Insufficient documentation

## 2016-12-16 DIAGNOSIS — Z711 Person with feared health complaint in whom no diagnosis is made: Secondary | ICD-10-CM | POA: Insufficient documentation

## 2016-12-16 DIAGNOSIS — Y939 Activity, unspecified: Secondary | ICD-10-CM | POA: Insufficient documentation

## 2016-12-16 DIAGNOSIS — Y999 Unspecified external cause status: Secondary | ICD-10-CM | POA: Insufficient documentation

## 2016-12-16 MED ORDER — BACITRACIN ZINC 500 UNIT/GM EX OINT: 1.0000 "application " | TOPICAL_OINTMENT | Freq: Three times a day (TID) | CUTANEOUS | 1 refills | Status: AC

## 2016-12-16 NOTE — ED Notes (Signed)
Bed: WTR7 Expected date:  Expected time:  Means of arrival:  Comments: 

## 2016-12-16 NOTE — ED Provider Notes (Signed)
[facility]-EMERGENCY DEPT Provider Note   CSN: [id number] Arrival date & time: 12/16/16  1137  By signing my name below, I, Deland PrettySherilynn Knight, attest that this documentation has been prepared under the direction and in the presence of Everlene FarrierWilliam Lashayla Armes, PA-C. Electronically Signed: Deland PrettySherilynn Knight, ED Scribe. 12/16/16. 12:26 PM.  History   Chief Complaint Chief Complaint  Patient presents with  . Rash   The history is provided by the patient. No language interpreter was used.   HPI Comments: Jeremy Adkins is a 21 y.o. male who presents to the Emergency Department complaining of a gradually worsening, persistent "burning" penile pain that began 2 weeks ago. The pt states that he had unprotected sex once 2 weeks ago, and once last night with the same male partner. He endorses that this partner has not had similar symptoms including vaginal discharge and dysuria. He also endorses frequent dry masturbation. He states that masturbation, erections and engaging in sexual intercourse exacerbates his symptoms. The pt denies abdominal pain, dysuria,  penile discharge, and testicular pain.  History reviewed. No pertinent past medical history.  There are no active problems to display for this patient.   Past Surgical History:  Procedure Laterality Date  . ORCHIOPEXY Bilateral 03/19/2016   Procedure: SCROTAL EXPLORATION AND BILATERAL ORCHIDOPEXY;  Surgeon: Bjorn PippinJohn Wrenn, MD;  Location: [facility];  Service: Urology;  Laterality: Bilateral;       Home Medications    Prior to Admission medications   Medication Sig Start Date End Date Taking? Authorizing Provider  bacitracin ointment Apply 1 application topically 3 (three) times daily. 12/16/16   Everlene Farrieransie, Cindel Daugherty, PA-C  calcium carbonate (TUMS - DOSED IN MG ELEMENTAL CALCIUM) 500 MG chewable tablet Chew 2 tablets by mouth 3 (three) times daily as needed for indigestion.     [provider]  HYDROcodone-acetaminophen (NORCO) 5-325 MG tablet Take 1 tablet  by mouth every 6 (six) hours as needed for moderate pain. 03/19/16   Bjorn PippinWrenn, John, MD  ibuprofen (ADVIL,MOTRIN) 200 MG tablet Take 400 mg by mouth every 6 (six) hours as needed. For pain    [provider]    Family History History reviewed. No pertinent family history.  Social History Social History  Substance Use Topics  . Smoking status: Current Some Day Smoker  . Smokeless tobacco: Never Used  . Alcohol use Yes     Comment: occ     Allergies   Patient has no known allergies.   Review of Systems Review of Systems  Constitutional: Negative for chills and fever.  HENT: Negative for mouth sores.   Gastrointestinal: Negative for abdominal pain, nausea and vomiting.  Genitourinary: Positive for genital sores. Negative for decreased urine volume, difficulty urinating, discharge, dysuria, frequency, hematuria, penile pain, penile swelling, testicular pain and urgency.  Skin: Positive for rash.     Physical Exam Updated Vital Signs BP 136/71 (BP Location: Left Arm)   Pulse (!) 54   Temp 97.9 F (36.6 C) (Oral)   Resp 18   Ht 6' (1.829 m)   Wt 63.7 kg (140 lb 8 oz)   SpO2 100%   BMI 19.06 kg/m   Physical Exam  Constitutional: He appears well-developed and well-nourished. No distress.  Nontoxic appearing.  HENT:  Head: Normocephalic and atraumatic.  Mouth/Throat: Oropharynx is clear and moist.  Eyes: Right eye exhibits no discharge. Left eye exhibits no discharge.  Pulmonary/Chest: Effort normal. No respiratory distress.  Abdominal: Soft. There is no tenderness. There is no guarding.  Genitourinary:  Genitourinary Comments: A GU exam with male PA student as chaperone. Patient has friction injury to the shaft of his penis on the ventral aspect. Skin has been rubbed raw with some broken skin. No vesicles or bulla. No other lesions or rashes. It is TTP. No penile discharge. No testicular TTP.   Neurological: He is alert. Coordination normal.  Skin: Skin is warm  and dry. Capillary refill takes less than 2 seconds. Rash noted. He is not diaphoretic. No erythema. No pallor.  Psychiatric: He has a normal mood and affect. His behavior is normal.  Nursing note and vitals reviewed.    ED Treatments / Results   DIAGNOSTIC STUDIES: Oxygen Saturation is 100% on RA, normal by my interpretation.   COORDINATION OF CARE: 12:00 PM-Discussed next steps with pt. Pt verbalized understanding and is agreeable with the plan.   Labs (all labs ordered are listed, but only abnormal results are displayed) Labs Reviewed  RPR  HIV ANTIBODY (ROUTINE TESTING)  GC/CHLAMYDIA PROBE AMP ([place]) NOT AT [facility]    EKG  EKG Interpretation None       Radiology No results found.  Procedures Procedures (including critical care time)  Medications Ordered in ED Medications - No data to display   Initial Impression / Assessment and Plan / ED Course  I have reviewed the triage vital signs and the nursing notes.  Pertinent labs & imaging results that were available during my care of the patient were reviewed by me and considered in my medical decision making (see chart for details).    This  is a 20 y.o. male who presents to the Emergency Department complaining of a gradually worsening, persistent "burning" penile pain that began 2 weeks ago. The pt states that he had unprotected sex once 2 weeks ago, and once last night with the same male partner. He endorses that this partner has not had similar symptoms including vaginal discharge and dysuria. He also endorses frequent dry masturbation. He states that masturbation, erections and engaging in sexual intercourse exacerbates his symptoms. The pt denies abdominal pain, dysuria,  penile discharge, and testicular pain.  On exam the patient is afebrile nontoxic appearing. On GU exam patient has an area of raw skin to the ventral aspect of his penis that is tender to palpation. There is broken skin to the site. It  appears he is rubbed the area overall from his dry masturbation. It is tender palpation. No vesicles or bulla noted. No other rashes noted. No testicular tenderness. No penile discharge. I have low suspicion for S Td currently this patient. Patient does request STD test as he is having unprotected sex. Will check for gonorrhea, chlamydia, HIV and syphilis. I advised of these tests are pending. He will need to follow-up on these test results. I discussed extensively states sex practices. We will discharge with bacitracin ointment and educated on methods to prevent breakdown of skin in this area. I advised the patient to follow-up with their primary care provider this week. I advised the patient to return to the emergency department with new or worsening symptoms or new concerns. The patient verbalized understanding and agreement with plan.      Final Clinical Impressions(s) / ED Diagnoses   Final diagnoses:  Abrasion of penis, initial encounter  Concern about STD in male without diagnosis    New Prescriptions New Prescriptions   BACITRACIN OINTMENT    Apply 1 application topically 3 (three) times daily.   I personally performed the services described  in this documentation, which was scribed in my presence. The recorded information has been reviewed and is accurate.       Everlene FarrierDansie, Lj Miyamoto, PA-C 12/16/16 1228    Mancel BaleWentz, Elliott, MD 12/17/16 [phone]

## 2016-12-16 NOTE — ED Triage Notes (Signed)
Patient c/o burning rash to penis x1 week. Reports having unprotected sex. Denies urinary sx and discharge.

## 2016-12-17 LAB — GC/CHLAMYDIA PROBE AMP (~~LOC~~) NOT AT ARMC
CHLAMYDIA, DNA PROBE: NEGATIVE
NEISSERIA GONORRHEA: NEGATIVE

## 2016-12-17 LAB — RPR: RPR: NONREACTIVE

## 2016-12-17 LAB — HIV ANTIBODY (ROUTINE TESTING W REFLEX): HIV SCREEN 4TH GENERATION: NONREACTIVE

## 2017-03-19 IMAGING — US US ART/VEN ABD/PELV/SCROTUM DOPPLER LTD
1 series · 13 of 25 positions shown · non-contrast
Comparison: None.

CLINICAL DATA: Acute onset left testicular pain several hours ago.

EXAM:
SCROTAL ULTRASOUND
DOPPLER ULTRASOUND OF THE TESTICLES
TECHNIQUE: Complete ultrasound examination of the testicles, epididymis, and
other scrotal structures was performed. Color and spectral Doppler
ultrasound were also utilized to evaluate blood flow to the
testicles.

[Series 1: us art/ven abd/pelv/scrotum doppler ltd · 0.07mm/px · 68 acquisitions, 13 frames shown]
[im 1/68]
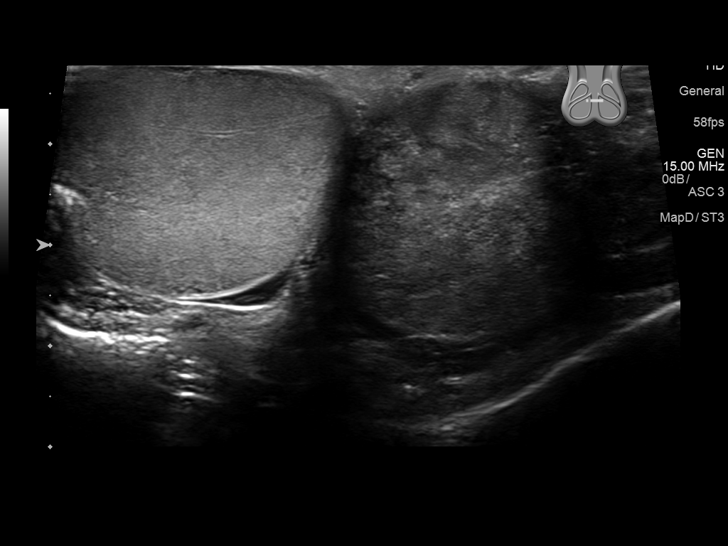
[im 6/68]
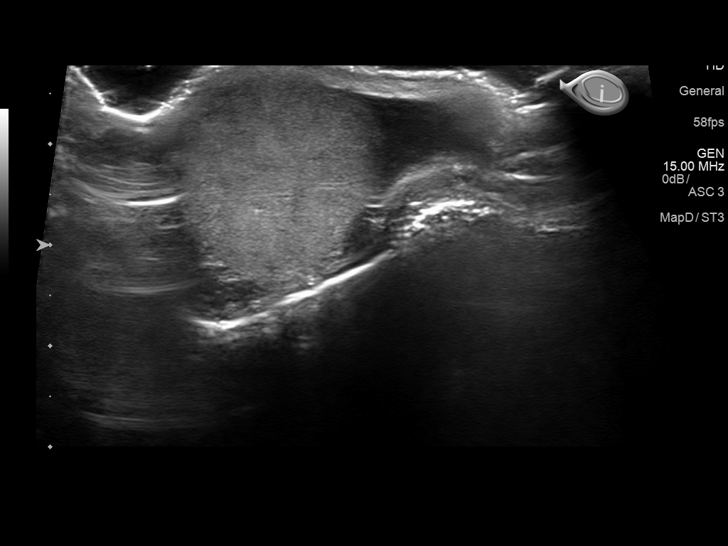
[im 12/68]
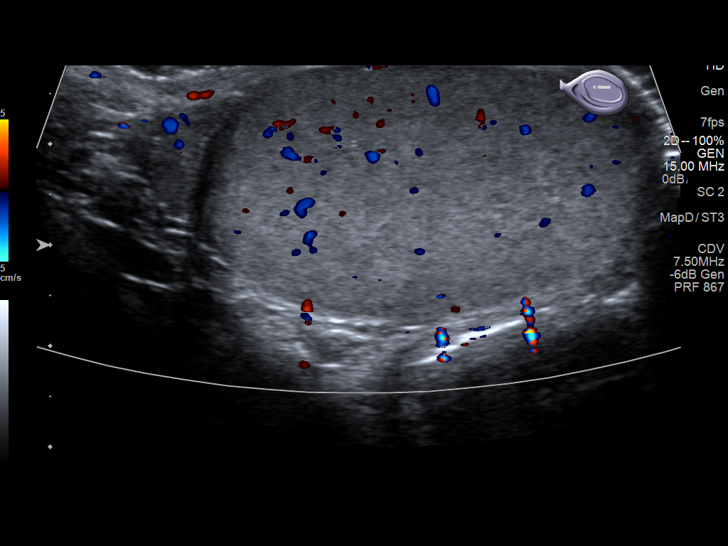
[im 17/68]
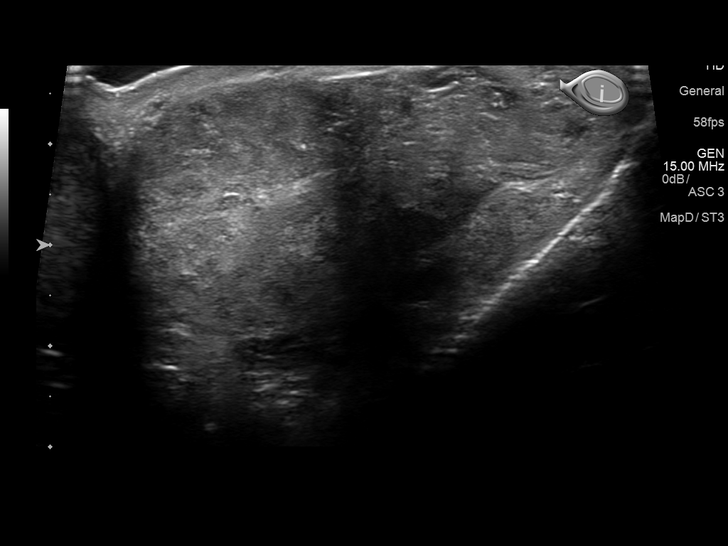
[im 23/68]
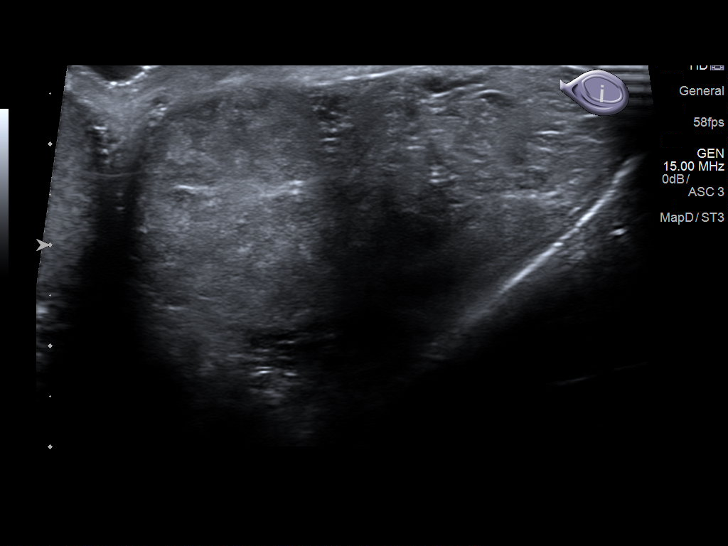
[im 28/68]
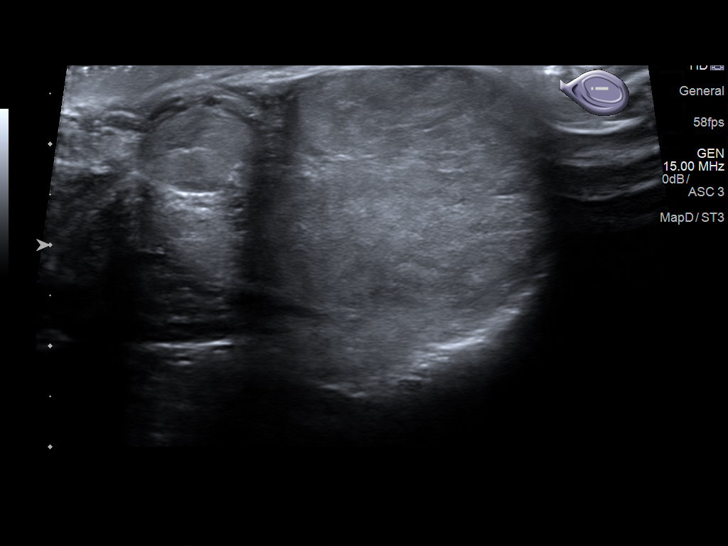
[im 34/68]
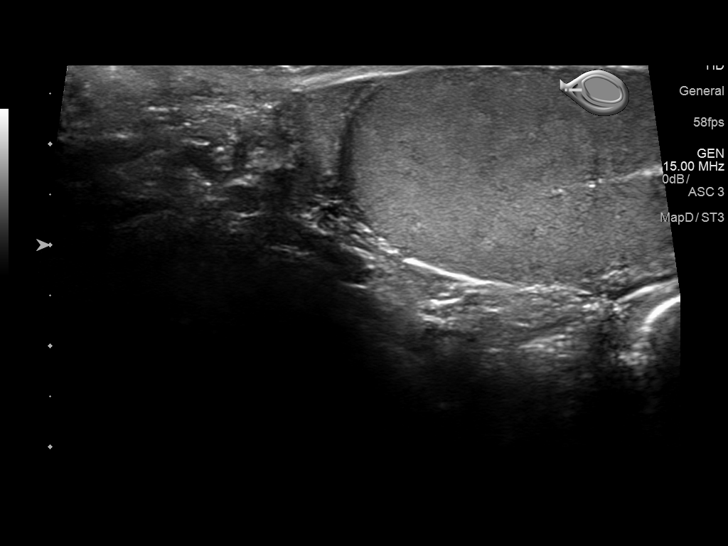
[im 40/68]
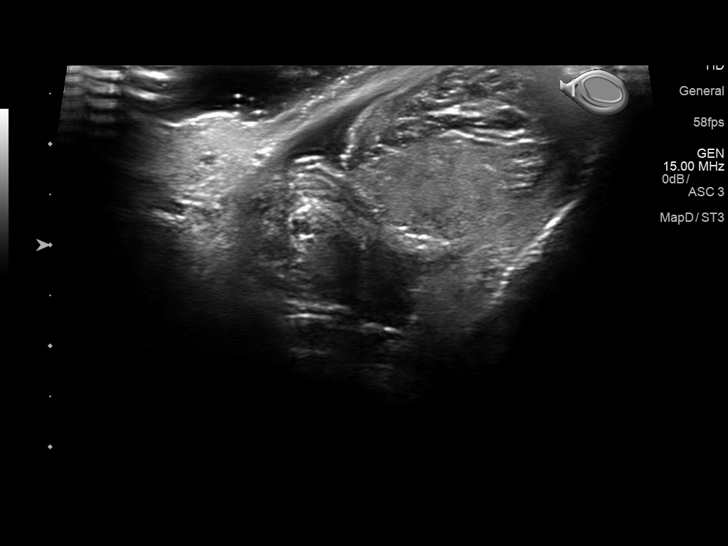
[im 45/68]
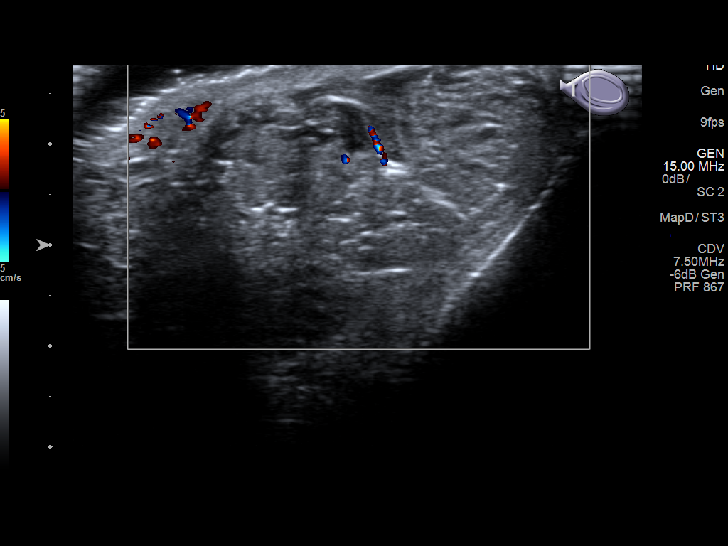
[im 51/68]
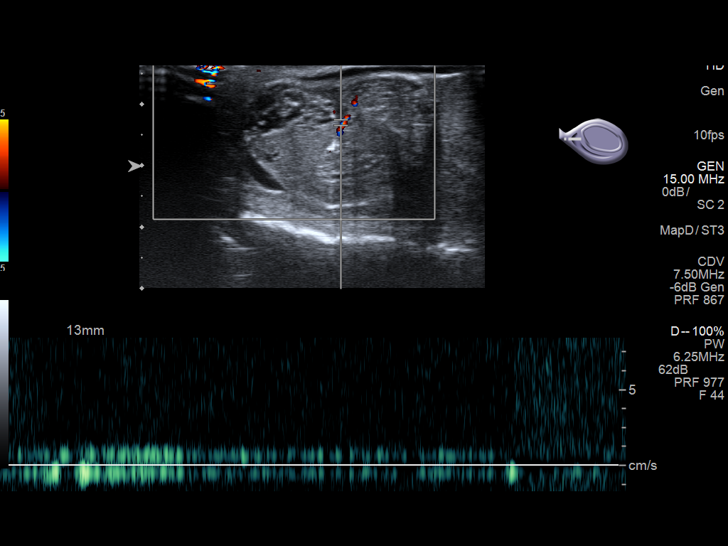
[im 56/68]
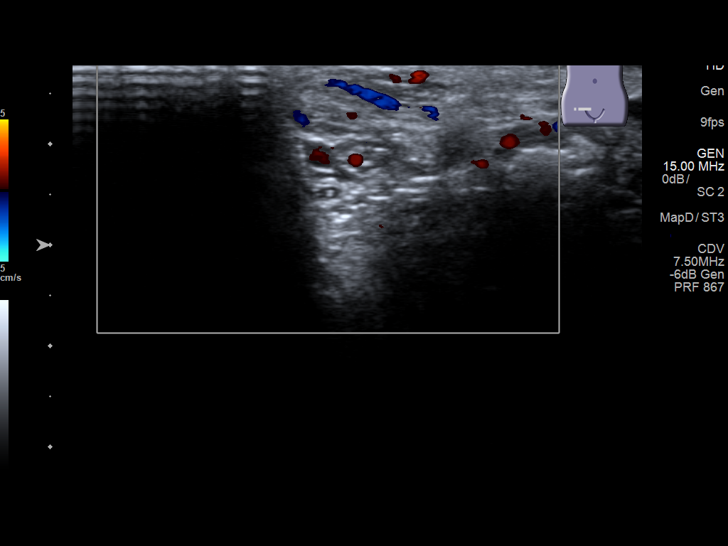
[im 62/68]
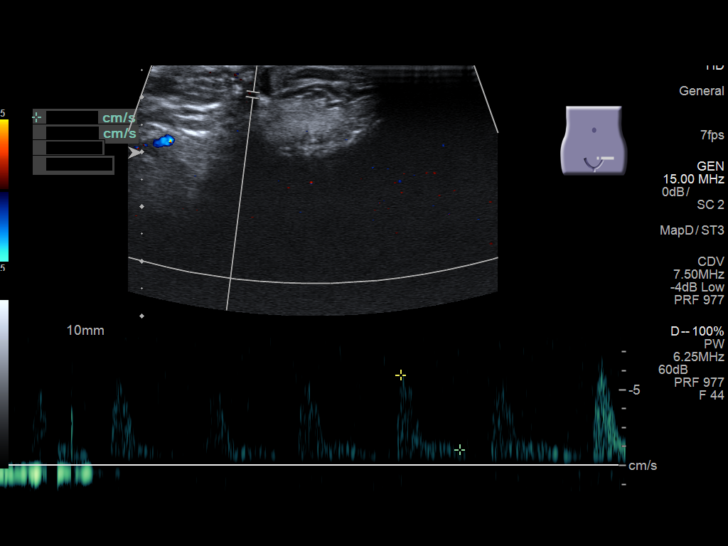
[im 68/68]
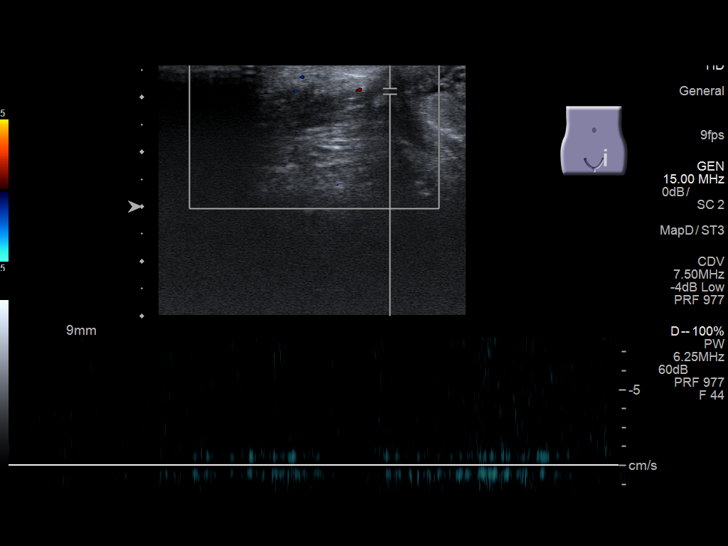

[13 of 25 positions shown; findings below may reference images not displayed]

FINDINGS: Right testicle

Measurements: 4.5 x 2.5 x 3.0 cm. No mass or microlithiasis
visualized. Internal blood flow seen on color Doppler ultrasound.

Left testicle

Measurements: 4.2 x 2.5 x 3.7 cm. Diffusely heterogeneous decreased
echotexture, without evidence of focal mass. Absence of internal
blood flow seen on color Doppler ultrasound.

Right epididymis:  Normal in size and appearance.

Left epididymis: Enlarged left epididymis with mild hyperemia on
color Doppler ultrasound.

Hydrocele:  None visualized.

Varicocele:  None visualized.

Pulsed Doppler interrogation of both testes demonstrates normal low
resistance arterial and venous waveforms within the right testicle,
however no arterial or venous waveforms could be obtained from the
right testicle.
IMPRESSION: Absence of blood flow in diffusely decreased heterogeneous
echotexture of left testicle, consistent with left testicular
torsion.

Critical Value/emergent results were called by telephone at the time
of interpretation on 03/19/2016 at [DATE] to Dr. Donis in the ED,
who verbally acknowledged these results.

## 2019-09-02 ENCOUNTER — Emergency Department (HOSPITAL_COMMUNITY)
Admission: EM | Admit: 2019-09-02 | Discharge: 2019-09-02 | Disposition: A | Discharge status: Home / Self Care | Payer: Self-pay | Attending: Emergency Medicine | Admitting: Emergency Medicine

## 2019-09-02 ENCOUNTER — Other Ambulatory Visit: Payer: Self-pay

## 2019-09-02 ENCOUNTER — Emergency Department (HOSPITAL_COMMUNITY): Payer: Self-pay

## 2019-09-02 ENCOUNTER — Encounter (HOSPITAL_COMMUNITY): Payer: Self-pay | Admitting: Emergency Medicine

## 2019-09-02 DIAGNOSIS — R079 Chest pain, unspecified: Secondary | ICD-10-CM

## 2019-09-02 DIAGNOSIS — F17210 Nicotine dependence, cigarettes, uncomplicated: Secondary | ICD-10-CM | POA: Insufficient documentation

## 2019-09-02 DIAGNOSIS — Z79899 Other long term (current) drug therapy: Secondary | ICD-10-CM | POA: Insufficient documentation

## 2019-09-02 DIAGNOSIS — R0789 Other chest pain: Secondary | ICD-10-CM | POA: Insufficient documentation

## 2019-09-02 DIAGNOSIS — K0889 Other specified disorders of teeth and supporting structures: Secondary | ICD-10-CM | POA: Insufficient documentation

## 2019-09-02 DIAGNOSIS — F1210 Cannabis abuse, uncomplicated: Secondary | ICD-10-CM | POA: Insufficient documentation

## 2019-09-02 LAB — BASIC METABOLIC PANEL
Anion gap: 9 (ref 5–15)
BUN: 13 mg/dL (ref 6–20)
CO2: 26 mmol/L (ref 22–32)
Calcium: 9.2 mg/dL (ref 8.9–10.3)
Chloride: 105 mmol/L (ref 98–111)
Creatinine, Ser: 0.98 mg/dL (ref 0.61–1.24)
GFR calc Af Amer: >60 mL/min (ref >60)
GFR calc non Af Amer: >60 mL/min (ref >60)
Glucose, Bld: 119 mg/dL — ABNORMAL HIGH (ref 70–99)
Potassium: 3.4 mmol/L — ABNORMAL LOW (ref 3.5–5.1)
Sodium: 140 mmol/L (ref 135–145)

## 2019-09-02 LAB — CBC
HCT: 41.2 % (ref 39.0–52.0)
Hemoglobin: 14.2 g/dL (ref 13.0–17.0)
MCH: 30.3 pg (ref 26.0–34.0)
MCHC: 34.5 g/dL (ref 30.0–36.0)
MCV: 87.8 fL (ref 80.0–100.0)
Platelets: 207 10*3/uL (ref 150–400)
RBC: 4.69 MIL/uL (ref 4.22–5.81)
RDW: 12.3 % (ref 11.5–15.5)
WBC: 7.3 10*3/uL (ref 4.0–10.5)
nRBC: 0 % (ref 0.0–0.2)

## 2019-09-02 LAB — TROPONIN I (HIGH SENSITIVITY)
Troponin I (High Sensitivity): <2 ng/L (ref <18)
Troponin I (High Sensitivity): 3 ng/L (ref <18)

## 2019-09-02 MED ORDER — SODIUM CHLORIDE 0.9% FLUSH: 3.0000 mL | Freq: Once | INTRAVENOUS | Status: DC

## 2019-09-02 NOTE — Discharge Instructions (Signed)
Remember to work on staying hydrated.  Water is great but you can also supplement with electrolyte replacement beverages like Gatorade or Powerade.

## 2019-09-02 NOTE — ED Notes (Signed)
Patient verbalizes understanding of discharge instructions . Opportunity for questions and answers were provided . Armband removed by staff ,Pt discharged from ED. W/C  offered at D/C  and Declined W/C at D/C and was escorted to lobby by RN.  

## 2019-09-02 NOTE — ED Notes (Signed)
PT REPORTS WHEN HE PEES HIS URINE IS FOAMY  AND WHEN I SPIT IN A CUP IT IS FOAMY.   PT REPORTS HE RESEARHED  SX'S HE HAD COPD OR CANDIDA

## 2019-09-02 NOTE — ED Provider Notes (Signed)
Jeremy Adkins [facility] EMERGENCY DEPARTMENT Provider Note   CSN: [id number] Arrival date & time: 09/02/19  0111     History Chief Complaint  Patient presents with  . Chest Pain  . Dental Pain    Jeremy Adkins is a 24 y.o. male.  HPI HPI Comments: Jeremy Adkins is a 24 y.o. male who presents to the Emergency Department complaining of chronic chest pain and dental issues.  Patient states that he has been experiencing intermittent chest pain since he was a child.  He states that he can stretch his back and "make his chest pop" resulting in some short-term sharp pain that spontaneously resolves.  This happens on a nearly daily basis.  He denies any acute shortness of breath, leg swelling, abdominal pain, nausea, vomiting, diarrhea.  Patient also notes dental issues for the past month.  He states that he feels like his "mouth has changed" and that he is "spitting foam".  He sometimes notes that he will "feel funny when he swallows".  He was seen at another emergency department for this and he states he was told to start gargling salt water which has helped some.  He smokes marijuana regularly.  He notes a history of smoking cigars as a teenager.  He denies fevers, chills, difficulty swallowing, rhinorrhea, congestion, sore throat, syncope.    History reviewed. No pertinent past medical history.  There are no problems to display for this patient.   Past Surgical History:  Procedure Laterality Date  . ORCHIOPEXY Bilateral 03/19/2016   Procedure: SCROTAL EXPLORATION AND BILATERAL ORCHIDOPEXY;  Surgeon: Bjorn Pippin, MD;  Location: [facility] OR[facility];  Service: Urology;  Laterality: Bilateral;       No family history on file.  Social History   Tobacco Use  . Smoking status: Current Some Day Smoker  . Smokeless tobacco: Never Used  Substance Use Topics  . Alcohol use: Yes    Comment: occ  . Drug use: Yes    Types: Marijuana    Home Medications Prior to Admission medications     Medication Sig Start Date End Date Taking? Authorizing Provider  bacitracin ointment Apply 1 application topically 3 (three) times daily. 12/16/16   Everlene Farrier, PA-C  calcium carbonate (TUMS - DOSED IN MG ELEMENTAL CALCIUM) 500 MG chewable tablet Chew 2 tablets by mouth 3 (three) times daily as needed for indigestion.     [provider]  HYDROcodone-acetaminophen (NORCO) 5-325 MG tablet Take 1 tablet by mouth every 6 (six) hours as needed for moderate pain. 03/19/16   Bjorn Pippin, MD  ibuprofen (ADVIL,MOTRIN) 200 MG tablet Take 400 mg by mouth every 6 (six) hours as needed. For pain    [provider]    Allergies    Patient has no known allergies.  Review of Systems   Review of Systems  Constitutional: Negative for chills and fever.  HENT: Negative for congestion, postnasal drip, rhinorrhea, sinus pressure and sore throat.   Eyes: Negative for visual disturbance.  Respiratory: Negative for cough and shortness of breath.   Cardiovascular: Positive for chest pain. Negative for leg swelling.  Gastrointestinal: Negative for abdominal pain, diarrhea, nausea and vomiting.  Genitourinary: Negative for dysuria and hematuria.  Skin: Negative for rash.  Neurological: Negative for syncope, light-headedness and headaches.   Physical Exam Updated Vital Signs BP (!) 143/83 (BP Location: Right Arm)   Pulse (!) 45   Temp 98.6 F (37 C) (Oral)   Resp 14   Ht 6' (1.829  m)   Wt 63.5 kg   SpO2 100%   BMI 18.99 kg/m   Physical Exam Vitals and nursing note reviewed.  Constitutional:      General: He is not in acute distress.    Appearance: Normal appearance. He is well-developed and normal weight. He is not ill-appearing, toxic-appearing or diaphoretic.  HENT:     Head: Normocephalic and atraumatic.     Comments: Dry mucous membranes    Right Ear: External ear normal.     Left Ear: External ear normal.     Nose: Nose normal.     Mouth/Throat:     Mouth: Mucous  membranes are moist.     Pharynx: Oropharynx is clear. No oropharyngeal exudate or posterior oropharyngeal erythema.  Eyes:     Extraocular Movements: Extraocular movements intact.  Cardiovascular:     Rate and Rhythm: Regular rhythm. Bradycardia present.     Pulses: Normal pulses.          Radial pulses are 2+ on the right side and 2+ on the left side.       Dorsalis pedis pulses are 2+ on the left side.     Heart sounds: Normal heart sounds. No murmur. No friction rub. No gallop.   Pulmonary:     Effort: Pulmonary effort is normal. No respiratory distress.     Breath sounds: Normal breath sounds. No stridor. No wheezing, rhonchi or rales.  Chest:     Chest wall: Tenderness (Mild tenderness noted overlying the sternal region) present. No deformity or crepitus.  Abdominal:     General: Abdomen is flat.     Palpations: Abdomen is soft.     Tenderness: There is no abdominal tenderness. There is no guarding or rebound.  Musculoskeletal:        General: Normal range of motion.     Cervical back: Normal range of motion and neck supple. No tenderness.     Right lower leg: No tenderness. No edema.     Left lower leg: No tenderness. No edema.  Skin:    General: Skin is warm and dry.  Neurological:     General: No focal deficit present.     Mental Status: He is alert and oriented to person, place, and time.  Psychiatric:        Mood and Affect: Mood is anxious.        Behavior: Behavior normal.    ED Results / Procedures / Treatments   Labs (all labs ordered are listed, but only abnormal results are displayed) Labs Reviewed  BASIC METABOLIC PANEL - Abnormal; Notable for the following components:      Result Value   Potassium 3.4 (*)    Glucose, Bld 119 (*)    All other components within normal limits  CBC  TROPONIN I (HIGH SENSITIVITY)  TROPONIN I (HIGH SENSITIVITY)   EKG None  Radiology DG Chest 2 View  Result Date: 09/02/2019 CLINICAL DATA:  Chest pain, tightness and  pain EXAM: CHEST - 2 VIEW COMPARISON:  None. FINDINGS: No consolidation, features of edema, pneumothorax, or effusion. Pulmonary vascularity is normally distributed. The cardiomediastinal contours are unremarkable. No acute osseous or soft tissue abnormality. IMPRESSION: No acute cardiopulmonary abnormality. Electronically Signed   By: Kreg Shropshire M.D.   On: 09/02/2019 01:45    Procedures Procedures (including critical care time)  Medications Ordered in ED Medications  sodium chloride flush (NS) 0.9 % injection 3 mL (has no administration in time range)  ED Course  I have reviewed the triage vital signs and the nursing notes.  Pertinent labs & imaging results that were available during my care of the patient were reviewed by me and considered in my medical decision making (see chart for details).    MDM Rules/Calculators/A&P                      Patient is a pleasant 24 year old male that presents with chronic musculoskeletal chest pain as well as a month of "foamy spit".  He has some mild chest pain when palpating the sternal region but otherwise am not palpating nor visualizing any abnormalities.  ECG and chest x-ray are both reassuring.  He had a negative troponin.  I discussed this with the patient.  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  We discussed adequate hydration as well as supplementing with electrolyte replacement beverages if he finds it to be necessary.  He has no insurance, so I gave him a referral to [facility] health and wellness and he knows to follow-up with them tomorrow to schedule an appointment.  He was amicable with the above plan and his questions were answered.  His vital signs are stable.  Patient discharged to home/self care.  Condition at discharge: Stable  Note: Portions of this report may have been transcribed using voice recognition software. Every effort was made to ensure accuracy; however, inadvertent computerized  transcription errors may be present.    Final Clinical Impression(s) / ED Diagnoses Final diagnoses:  Chest pain, unspecified type    Rx / DC Orders ED Discharge Orders    None       Rayna Sexton, PA-C 09/02/19 0932    Sherwood Gambler, MD 09/03/19 [phone]

## 2019-09-02 NOTE — ED Triage Notes (Signed)
Pt reports chest tightness/pain since childhood.  Also reports mouth pain, "I think I have an infection in my mouth, my spit feels funny."

## 2020-05-03 ENCOUNTER — Emergency Department (HOSPITAL_COMMUNITY)
Admission: EM | Admit: 2020-05-03 | Discharge: 2020-05-04 | Disposition: A | Discharge status: Home / Self Care | Payer: Self-pay | Attending: Emergency Medicine | Admitting: Emergency Medicine

## 2020-05-03 ENCOUNTER — Other Ambulatory Visit: Payer: Self-pay

## 2020-05-03 DIAGNOSIS — R4182 Altered mental status, unspecified: Secondary | ICD-10-CM | POA: Insufficient documentation

## 2020-05-03 DIAGNOSIS — Z79899 Other long term (current) drug therapy: Secondary | ICD-10-CM | POA: Insufficient documentation

## 2020-05-03 DIAGNOSIS — R1030 Lower abdominal pain, unspecified: Secondary | ICD-10-CM | POA: Insufficient documentation

## 2020-05-03 DIAGNOSIS — F17210 Nicotine dependence, cigarettes, uncomplicated: Secondary | ICD-10-CM | POA: Insufficient documentation

## 2020-05-03 DIAGNOSIS — R410 Disorientation, unspecified: Secondary | ICD-10-CM

## 2020-05-03 DIAGNOSIS — R202 Paresthesia of skin: Secondary | ICD-10-CM | POA: Insufficient documentation

## 2020-05-03 DIAGNOSIS — R42 Dizziness and giddiness: Secondary | ICD-10-CM | POA: Insufficient documentation

## 2020-05-03 LAB — CBC
HCT: 45 % (ref 39.0–52.0)
Hemoglobin: 15.3 g/dL (ref 13.0–17.0)
MCH: 30.1 pg (ref 26.0–34.0)
MCHC: 34 g/dL (ref 30.0–36.0)
MCV: 88.6 fL (ref 80.0–100.0)
Platelets: 210 10*3/uL (ref 150–400)
RBC: 5.08 MIL/uL (ref 4.22–5.81)
RDW: 12.3 % (ref 11.5–15.5)
WBC: 9.8 10*3/uL (ref 4.0–10.5)
nRBC: 0 % (ref 0.0–0.2)

## 2020-05-03 LAB — COMPREHENSIVE METABOLIC PANEL
ALT: 24 U/L (ref 0–44)
AST: 19 U/L (ref 15–41)
Albumin: 4.9 g/dL (ref 3.5–5.0)
Alkaline Phosphatase: 45 U/L (ref 38–126)
Anion gap: 11 (ref 5–15)
BUN: 15 mg/dL (ref 6–20)
CO2: 27 mmol/L (ref 22–32)
Calcium: 9.6 mg/dL (ref 8.9–10.3)
Chloride: 102 mmol/L (ref 98–111)
Creatinine, Ser: 1.11 mg/dL (ref 0.61–1.24)
GFR, Estimated: >60 mL/min (ref >60)
Glucose, Bld: 112 mg/dL — ABNORMAL HIGH (ref 70–99)
Potassium: 4.1 mmol/L (ref 3.5–5.1)
Sodium: 140 mmol/L (ref 135–145)
Total Bilirubin: 0.4 mg/dL (ref 0.3–1.2)
Total Protein: 7.7 g/dL (ref 6.5–8.1)

## 2020-05-03 LAB — URINALYSIS, ROUTINE W REFLEX MICROSCOPIC
Bilirubin Urine: NEGATIVE
Glucose, UA: NEGATIVE mg/dL
Hgb urine dipstick: NEGATIVE
Ketones, ur: NEGATIVE mg/dL
Nitrite: NEGATIVE
Protein, ur: NEGATIVE mg/dL
Specific Gravity, Urine: 1.006 (ref 1.005–1.030)
pH: 7 (ref 5.0–8.0)

## 2020-05-03 LAB — LIPASE, BLOOD: Lipase: 26 U/L (ref 11–51)

## 2020-05-03 MED ORDER — ONDANSETRON 4 MG PO TBDP
4.0000 mg | ORAL_TABLET | Freq: Once | ORAL | Status: AC
  Administered 2020-05-04: 4 mg via ORAL
  Filled 2020-05-03: qty 1

## 2020-05-03 NOTE — ED Triage Notes (Signed)
Pt states that he has had abdominal pain, poor appetite, tingling in extremities and difficulty focusing or feeling disoriented x 1 week. Alert and oriented.

## 2020-05-04 NOTE — ED Provider Notes (Signed)
[facility]-EMERGENCY DEPT Provider Note   CSN: [id number] Arrival date & time: 05/03/20  1737     History Chief Complaint  Patient presents with  . Abdominal Pain  . Altered Mental Status  . Tingling    Jeremy Adkins is a 24 y.o. male.  HPI     This is a 24 year old male with no reported past medical history who presents with "feeling weird."  Patient reports 1 week history of not feeling right.  He states "I feel like my levels are off."  He describes occasional dizziness.  When he has episodes of dizziness he sees spots in his vision.  No headaches.  No recent fevers, illnesses.  No known sick contacts or Covid exposures.  He states he has occasional lower abdominal pain that is worse when he eats.  He reports diminished appetite.  He has also experienced some bilateral hand tingling.  He has a history of anxiety but is unsure if this is related.  He is not currently having any abdominal pain.  Mother reports that they have a appointment with a primary care physician on Monday.  He did recently start some vitamin supplementation.  He also smokes marijuana daily.  No past medical history on file.  There are no problems to display for this patient.   Past Surgical History:  Procedure Laterality Date  . ORCHIOPEXY Bilateral 03/19/2016   Procedure: SCROTAL EXPLORATION AND BILATERAL ORCHIDOPEXY;  Surgeon: Bjorn Pippin, MD;  Location: [facility];  Service: Urology;  Laterality: Bilateral;       No family history on file.  Social History   Tobacco Use  . Smoking status: Current Some Day Smoker  . Smokeless tobacco: Never Used  Substance Use Topics  . Alcohol use: Yes    Comment: occ  . Drug use: Yes    Types: Marijuana    Home Medications Prior to Admission medications   Medication Sig Start Date End Date Taking? Authorizing Provider  bacitracin ointment Apply 1 application topically 3 (three) times daily. 12/16/16   Everlene Farrier, PA-C  calcium  carbonate (TUMS - DOSED IN MG ELEMENTAL CALCIUM) 500 MG chewable tablet Chew 2 tablets by mouth 3 (three) times daily as needed for indigestion.     [provider]  HYDROcodone-acetaminophen (NORCO) 5-325 MG tablet Take 1 tablet by mouth every 6 (six) hours as needed for moderate pain. 03/19/16   Bjorn Pippin, MD  ibuprofen (ADVIL,MOTRIN) 200 MG tablet Take 400 mg by mouth every 6 (six) hours as needed. For pain    [provider]    Allergies    Patient has no known allergies.  Review of Systems   Review of Systems  Constitutional: Negative for fever.  Respiratory: Negative for shortness of breath.   Cardiovascular: Negative for chest pain.  Gastrointestinal: Positive for abdominal pain and diarrhea. Negative for nausea and vomiting.  Genitourinary: Negative for dysuria.  Neurological: Positive for dizziness. Negative for weakness and headaches.  All other systems reviewed and are negative.   Physical Exam Updated Vital Signs BP 137/82   Pulse (!) 46   Temp 98.4 F (36.9 C) (Oral)   Resp 17   Ht 1.854 m (6\' 1" )   Wt 63.5 kg   SpO2 99%   BMI 18.47 kg/m   Physical Exam Vitals and nursing note reviewed.  Constitutional:      Appearance: He is well-developed and well-nourished.     Comments: Pleasant, nontoxic.  HENT:  Head: Normocephalic and atraumatic.     Mouth/Throat:     Mouth: Mucous membranes are moist.  Eyes:     Extraocular Movements: Extraocular movements intact.     Pupils: Pupils are equal, round, and reactive to light.  Cardiovascular:     Rate and Rhythm: Normal rate and regular rhythm.     Heart sounds: Normal heart sounds. No murmur heard.   Pulmonary:     Effort: Pulmonary effort is normal. No respiratory distress.     Breath sounds: Normal breath sounds. No wheezing.  Abdominal:     General: Bowel sounds are normal.     Palpations: Abdomen is soft.     Tenderness: There is no abdominal tenderness. There is no guarding or  rebound.  Musculoskeletal:        General: No edema.     Cervical back: Neck supple.     Right lower leg: No edema.     Left lower leg: No edema.  Lymphadenopathy:     Cervical: No cervical adenopathy.  Skin:    General: Skin is warm and dry.  Neurological:     Mental Status: He is alert and oriented to person, place, and time.     Comments: Alert and oriented x3, cranial nerves II through XII intact, fluent speech, 5 out of 5 strength in all 4 extremities, normal gait  Psychiatric:        Mood and Affect: Mood and affect and mood normal.     ED Results / Procedures / Treatments   Labs (all labs ordered are listed, but only abnormal results are displayed) Labs Reviewed  COMPREHENSIVE METABOLIC PANEL - Abnormal; Notable for the following components:      Result Value   Glucose, Bld 112 (*)    All other components within normal limits  URINALYSIS, ROUTINE W REFLEX MICROSCOPIC - Abnormal; Notable for the following components:   Color, Urine STRAW (*)    Leukocytes,Ua TRACE (*)    Bacteria, UA RARE (*)    All other components within normal limits  LIPASE, BLOOD  CBC    EKG EKG Interpretation  Date/Time:  Sunday May 04 2020 00:20:40 EST Ventricular Rate:  49 PR Interval:    QRS Duration: 80 QT Interval:  443 QTC Calculation: 400 R Axis:   86 Text Interpretation: Sinus bradycardia repolarization changes Confirmed by Ross Marcus ([id number]) on 05/04/2020 12:36:00 AM   Radiology No results found.  Procedures Procedures (including critical care time)  Medications Ordered in ED Medications  ondansetron (ZOFRAN-ODT) disintegrating tablet 4 mg (4 mg Oral Given 05/04/20 0033)    ED Course  I have reviewed the triage vital signs and the nursing notes.  Pertinent labs & imaging results that were available during my care of the patient were reviewed by me and considered in my medical decision making (see chart for details).    MDM Rules/Calculators/A&P                           Patient presents with several complaints.  He is overall nontoxic and vital signs are reassuring.  He reports dizziness, confusion, lower abdominal pain, paresthesias.  He is awake, alert, oriented.  His neurologic exam is intact.  He has no complaints at this time.  Lab work reviewed from triage.  No significant metabolic derangements.  He does report daily marijuana usage but no alcohol or other illicit drug use.  On my exam he does not have  any abdominal tenderness.  His lipase is normal as are his LFTs and there is no leukocytosis.  Doubt cholecystitis, pancreatitis, appendicitis.  EKG obtained.  Shows early repolarization abnormalities but no evidence of acute arrhythmia.  Orthostatics obtained and patient is not orthostatic.  He does not appear significantly dehydrated.  Patient and his mother both endorse a history of anxiety.  Given his paresthesias, and other vague complaints including anorexia, could be some component of anxiety or depression; however, we will have him follow-up with PCP as scheduled on Monday.  At this time do not feel he has an acute emergent process.  After history, exam, and medical workup I feel the patient has been appropriately medically screened and is safe for discharge home. Pertinent diagnoses were discussed with the patient. Patient was given return precautions.  Final Clinical Impression(s) / ED Diagnoses Final diagnoses:  Paresthesia  Confusion  Lower abdominal pain    Rx / DC Orders ED Discharge Orders    None       Shon Baton, MD 05/04/20 [phone]

## 2020-05-04 NOTE — Discharge Instructions (Signed)
You were seen today for several complaints.  Your lab work is reassuring.  Follow-up with your primary doctor scheduled on Monday.

## 2020-06-14 IMAGING — DX DG CHEST 2V
2 series · 2 of 2 positions shown · non-contrast
Comparison: None.

CLINICAL DATA: Chest pain, tightness and pain

EXAM:
CHEST - 2 VIEW

[chest pa]
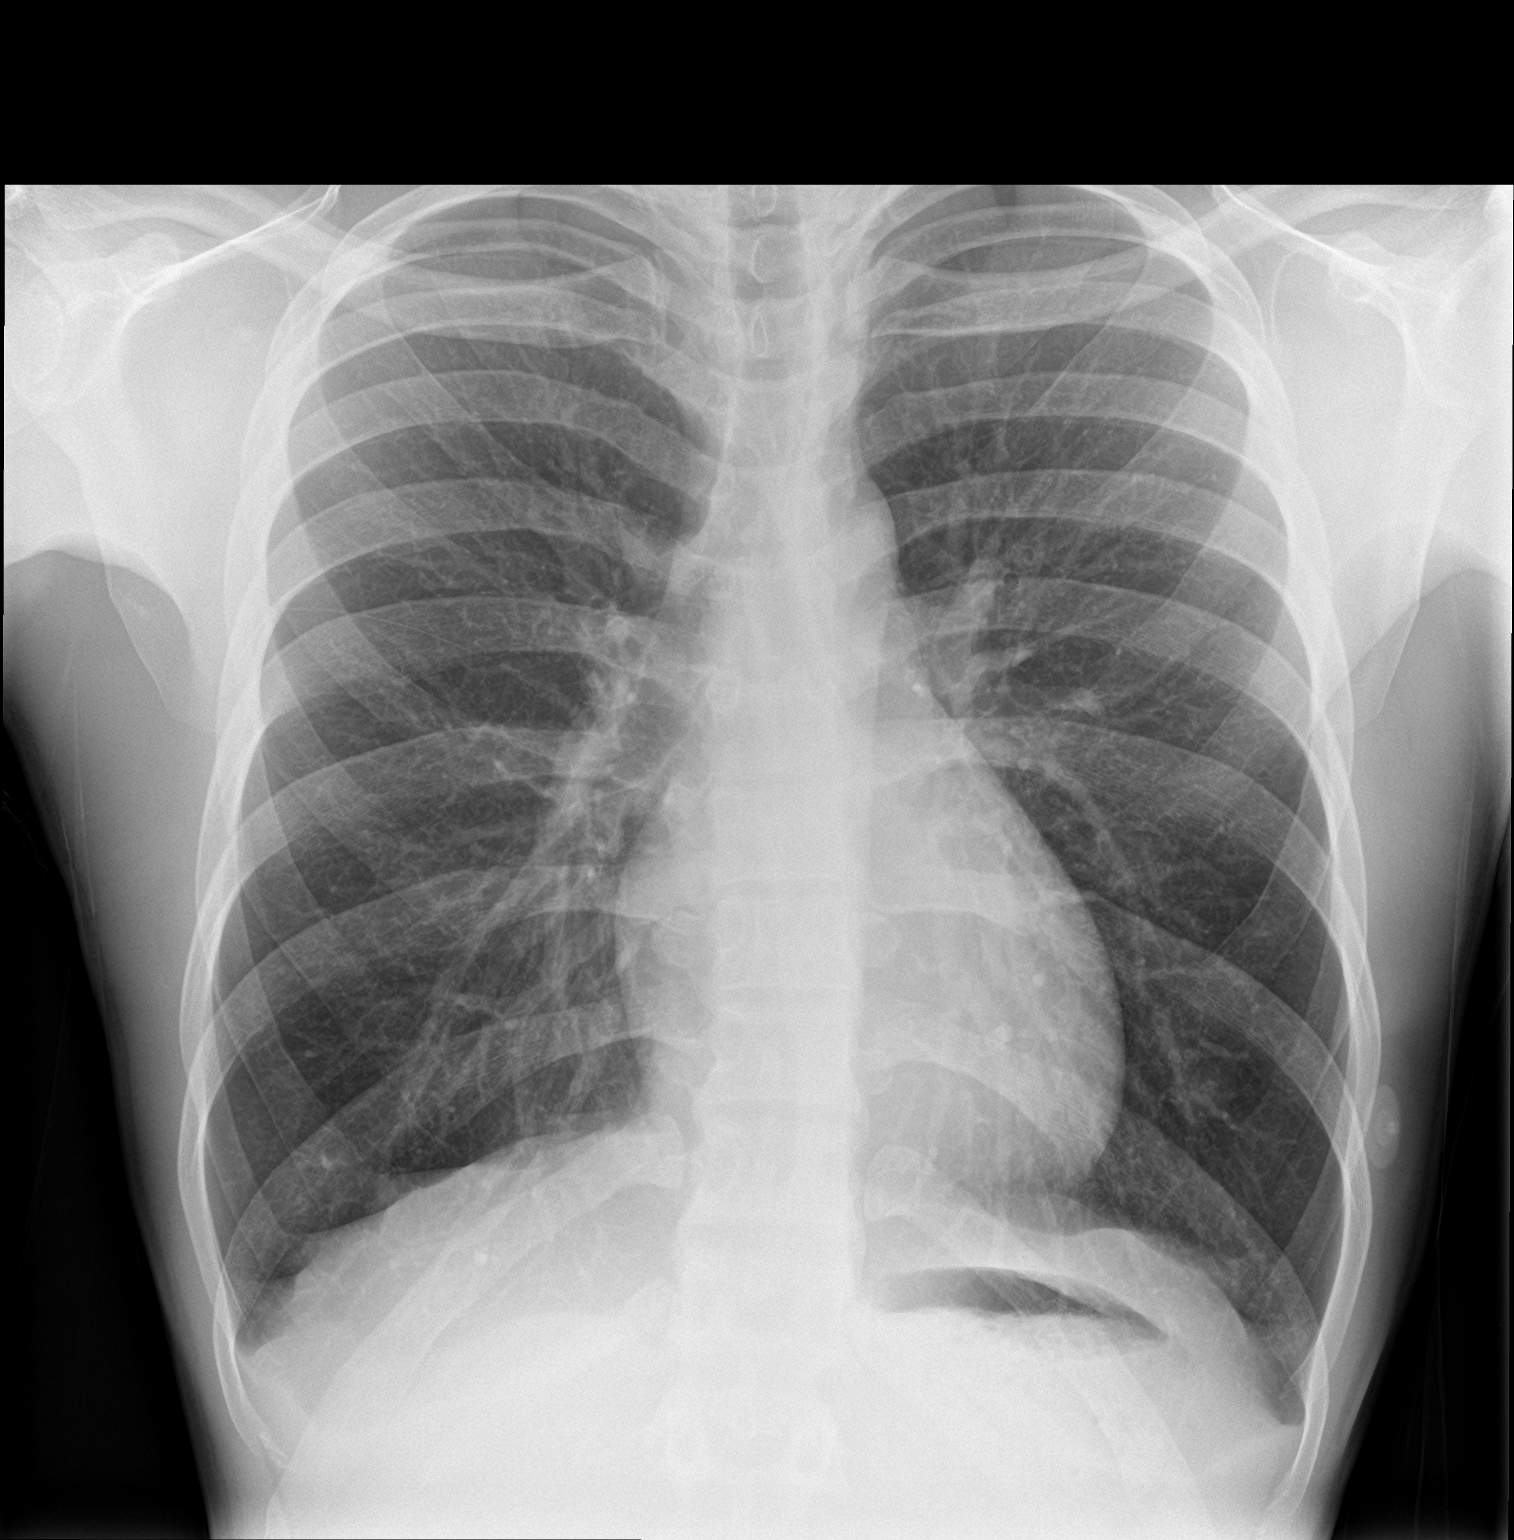

[chest lat]
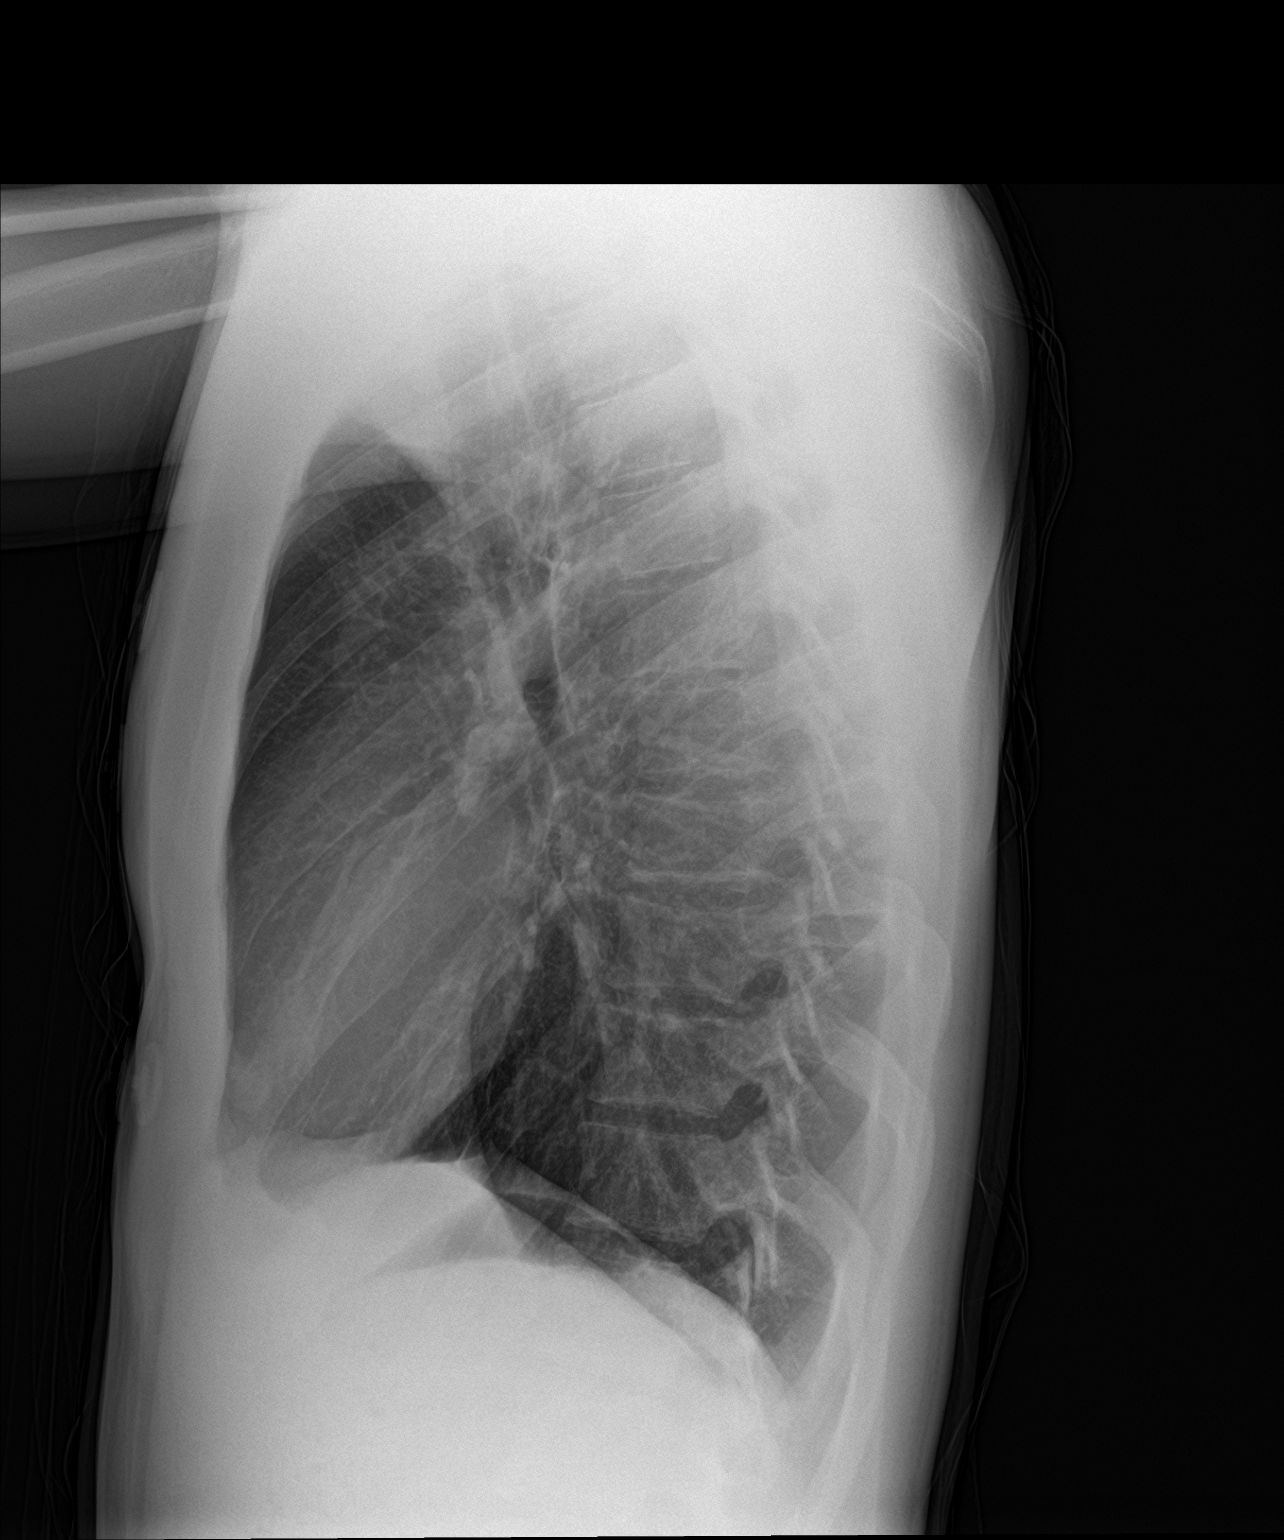

[2 of 2 positions shown; findings below may reference images not displayed]

FINDINGS: No consolidation, features of edema, pneumothorax, or effusion.
Pulmonary vascularity is normally distributed. The cardiomediastinal
contours are unremarkable. No acute osseous or soft tissue
abnormality.
IMPRESSION: No acute cardiopulmonary abnormality.

## 2021-08-01 ENCOUNTER — Emergency Department (HOSPITAL_COMMUNITY)
Admission: EM | Admit: 2021-08-01 | Discharge: 2021-08-01 | Disposition: A | Discharge status: Home / Self Care | Payer: Self-pay | Attending: Emergency Medicine | Admitting: Emergency Medicine

## 2021-08-01 ENCOUNTER — Encounter (HOSPITAL_COMMUNITY): Payer: Self-pay

## 2021-08-01 DIAGNOSIS — B356 Tinea cruris: Secondary | ICD-10-CM | POA: Insufficient documentation

## 2021-08-01 LAB — URINALYSIS, ROUTINE W REFLEX MICROSCOPIC
Bilirubin Urine: NEGATIVE
Glucose, UA: NEGATIVE mg/dL
Hgb urine dipstick: NEGATIVE
Ketones, ur: NEGATIVE mg/dL
Leukocytes,Ua: NEGATIVE
Nitrite: NEGATIVE
Protein, ur: NEGATIVE mg/dL
Specific Gravity, Urine: 1.024 (ref 1.005–1.030)
pH: 5 (ref 5.0–8.0)

## 2021-08-01 MED ORDER — CLOTRIMAZOLE 1 % EX CREA: TOPICAL_CREAM | CUTANEOUS | 0 refills | Status: AC

## 2021-08-01 NOTE — ED Provider Notes (Signed)
?[facility] EMERGENCY DEPARTMENT ?Provider Note ? ? ?CSN: [id number] ?Arrival date & time: 08/01/21  1338 ? ?  ? ?History ? ?CC: Rash ? ? ?Jeremy Adkins is a 26 y.o. male present emergency department with redness and tenderness around his groin.  Patient reports that he had a new male sexual partner approximately 1 month ago, when he went home that evening after intercourse he noted that he was having some redness around his groin.  He did report some "small little bumps" but mostly itching and redness.  He applied Goldbond powder at home and the rash seemed to go away but now has come back.  He reports perhaps some very mild dysuria or stinging or burning when he urinates.  He denies penile discharge.  He reports he was treated for gonorrhea when he was 82 but denies any more recent STI treatment. ? ?HPI ? ?  ? ?Home Medications ?Prior to Admission medications   ?Medication Sig Start Date End Date Taking? Authorizing Provider  ?clotrimazole (LOTRIMIN) 1 % cream Apply to affected area 2 times daily for 10 days 08/01/21  Yes Emerie Vanderkolk, Kermit Balo, MD  ?bacitracin ointment Apply 1 application topically 3 (three) times daily. 12/16/16   Everlene Farrier, PA-C  ?calcium carbonate (TUMS - DOSED IN MG ELEMENTAL CALCIUM) 500 MG chewable tablet Chew 2 tablets by mouth 3 (three) times daily as needed for indigestion.     [provider]  ?HYDROcodone-acetaminophen (NORCO) 5-325 MG tablet Take 1 tablet by mouth every 6 (six) hours as needed for moderate pain. 03/19/16   Bjorn Pippin, MD  ?ibuprofen (ADVIL,MOTRIN) 200 MG tablet Take 400 mg by mouth every 6 (six) hours as needed. For pain    [provider]  ?   ? ?Allergies    ?Patient has no known allergies.   ? ?Review of Systems   ?Review of Systems ? ?Physical Exam ?Updated Vital Signs ?BP 131/85   Pulse 70   Temp 98.5 ?F (36.9 ?C) (Oral)   Resp 16   SpO2 100%  ?Physical Exam ?Constitutional:   ?   General: He is not in acute distress. ?HENT:   ?   Head: Normocephalic and atraumatic.  ?Eyes:  ?   Conjunctiva/sclera: Conjunctivae normal.  ?   Pupils: Pupils are equal, round, and reactive to light.  ?Cardiovascular:  ?   Rate and Rhythm: Normal rate and regular rhythm.  ?Pulmonary:  ?   Effort: Pulmonary effort is normal. No respiratory distress.  ?Genitourinary: ?   Comments: Circumcised male with normal penis, no drainage, no erythema of the meatus ?Erythematous rash extends the bilateral inguinal folds and below the scrotum ?Testicles grossly unremarkable ?No open sores, pustules, chancres or lesion ?Skin: ?   General: Skin is warm and dry.  ?Neurological:  ?   General: No focal deficit present.  ?   Mental Status: He is alert. Mental status is at baseline.  ?Psychiatric:     ?   Mood and Affect: Mood normal.     ?   Behavior: Behavior normal.  ? ? ?ED Results / Procedures / Treatments   ?Labs ?(all labs ordered are listed, but only abnormal results are displayed) ?Labs Reviewed  ?URINALYSIS, ROUTINE W REFLEX MICROSCOPIC  ?GC/CHLAMYDIA PROBE AMP ([place]) NOT AT [facility]  ? ? ?EKG ?None ? ?Radiology ?No results found. ? ?Procedures ?Procedures  ? ? ?Medications Ordered in ED ?Medications - No data to display ? ?ED Course/ Medical Decision Making/ A&P ?  ?                        ?  Medical Decision Making ?Amount and/or Complexity of Data Reviewed ?Labs: ordered. ? ? ?Patient is here with a jock rash, strongly suspected.  Will prescribe an antifungal medication.  Advised to continue Goldbond powder otherwise and keep clean and dry shorts and underpants on whenever possible.  UA does not show evidence of infection per my review.  Overall he is relatively low risk for STI, and he would prefer to wait and follow-up on his results prior to treatment in the ED with antibiotics.  I think this is reasonable. ? ?Doubt syphilis, necrotizing fasciitis, Fournier's gangrene, or other life-threatening infection based on his clinical presentation ? ? ? ? ? ? ? ?Final  Clinical Impression(s) / ED Diagnoses ?Final diagnoses:  ?Tinea cruris  ? ? ?Rx / DC Orders ?ED Discharge Orders   ? ?      Ordered  ?  clotrimazole (LOTRIMIN) 1 % cream       ? 08/01/21 1648  ? ?  ?  ? ?  ? ? ?  ?Terald Sleeper, MD ?08/01/21 2027 ? ?

## 2021-08-01 NOTE — ED Triage Notes (Signed)
Pt reports concern for STD. Reports having bumps on penis and in perineal area. Endorses burning with urination, foul odor. No discharge per pt.  ?

## 2021-09-24 ENCOUNTER — Encounter (HOSPITAL_COMMUNITY): Payer: Self-pay

## 2021-09-24 ENCOUNTER — Emergency Department (HOSPITAL_COMMUNITY): Payer: Self-pay

## 2021-09-24 ENCOUNTER — Emergency Department (HOSPITAL_COMMUNITY)
Admission: EM | Admit: 2021-09-24 | Discharge: 2021-09-24 | Disposition: A | Discharge status: Home / Self Care | Payer: Self-pay | Attending: Emergency Medicine | Admitting: Emergency Medicine

## 2021-09-24 ENCOUNTER — Other Ambulatory Visit: Payer: Self-pay

## 2021-09-24 DIAGNOSIS — R10A2 Flank pain, left side: Secondary | ICD-10-CM

## 2021-09-24 DIAGNOSIS — R109 Unspecified abdominal pain: Secondary | ICD-10-CM | POA: Insufficient documentation

## 2021-09-24 LAB — URINALYSIS, ROUTINE W REFLEX MICROSCOPIC
Bilirubin Urine: NEGATIVE
Glucose, UA: NEGATIVE mg/dL
Hgb urine dipstick: NEGATIVE
Ketones, ur: NEGATIVE mg/dL
Leukocytes,Ua: NEGATIVE
Nitrite: NEGATIVE
Protein, ur: NEGATIVE mg/dL
Specific Gravity, Urine: 1.021 (ref 1.005–1.030)
pH: 5 (ref 5.0–8.0)

## 2021-09-24 NOTE — ED Provider Triage Note (Addendum)
Emergency Medicine Provider Triage Evaluation Note ? ?Jeremy Adkins , a 26 y.o. male  was evaluated in triage.  Pt complains of left side pain, occasional pleuritic pain with smoking. He expressed some concern about "something being in my blood stream". Denies any STI sx, reports fully tested for STIs, no new sexual partners since last test. Just wants to double check everything okay with lungs, kidneys. Denies fever, chills, nausea, vomiting, hx of UTIs. ? ?Review of Systems  ?Positive: Flank pain ?Negative: As above ? ?Physical Exam  ?BP (!) 135/109 (BP Location: Right Arm)   Pulse 63   Temp 98.2 ?F (36.8 ?C) (Oral)   Resp 16   Ht 6\' 1"  (1.854 m)   Wt 77.1 kg   SpO2 100%   BMI 22.43 kg/m?  ?Gen:   Awake, no distress   ?Resp:  Normal effort  ?MSK:   Moves extremities without difficulty  ?Other:  Some reproducible TTP of left flank, seems consistent with MSK pain ? ?Medical Decision Making  ?Medically screening exam initiated at 12:54 PM.  Appropriate orders placed.  Jeremy Adkins was informed that the remainder of the evaluation will be completed by another provider, this initial triage assessment does not replace that evaluation, and the importance of remaining in the ED until their evaluation is complete. ? ?Workup initiated, likely rapid DC ? ?  ?Jeremy Ludwig, PA-C ?09/24/21 1255 ? ?

## 2021-09-24 NOTE — Discharge Instructions (Signed)
Please take Ibuprofen and Tylenol as needed for pain. Apply heat/warm compresses as needed.  ? ?Follow up with your PCP. If you do not have one you can follow up with [facility] for primary care needs ? ?Return to the ED for any new/worsening symptoms ?

## 2021-09-24 NOTE — ED Triage Notes (Signed)
Patient c/o left flank pain x 1 1/2 weeks ago. ?Patient stated, "Me and this girl had sex and I was negative for STD's, but now I am having this pain in my side and I think something might be in my blood stream." ?

## 2021-09-24 NOTE — ED Provider Notes (Signed)
?[facility]-EMERGENCY DEPT ?Provider Note ? ? ?CSN: [id number] ?Arrival date & time: 09/24/21  1159 ? ?  ? ?History ? ?Chief Complaint  ?Patient presents with  ? Flank Pain  ? ? ?Jeremy Adkins is a 26 y.o. male who presents to the ED today with complaint of remittent left-sided flank pain that began 1.5 weeks ago.  Patient reports that the pain is sharp and stabbing nature at times.  He does report that yesterday he had some pain radiating into his anterior thigh.  He denies any nausea or vomiting.  He does admit that he recently had unprotected intercourse with a male and noted some "bumps" at the end of his hair follicles.  He states that he was screened for all STDs and returned negative and was told that his symptoms may be related to folliculitis.  Patient states that he becomes concerned when his pain started and was concerned that something traveled up into his kidney.  He denies any nausea, vomiting, dysuria, urinary frequency, hematuria, or any other associated symptoms.  ? ?The history is provided by the patient and medical records.  ? ?  ? ?Home Medications ?Prior to Admission medications   ?Medication Sig Start Date End Date Taking? Authorizing Provider  ?bacitracin ointment Apply 1 application topically 3 (three) times daily. 12/16/16   Everlene Farrier, PA-C  ?calcium carbonate (TUMS - DOSED IN MG ELEMENTAL CALCIUM) 500 MG chewable tablet Chew 2 tablets by mouth 3 (three) times daily as needed for indigestion.     [provider]  ?clotrimazole (LOTRIMIN) 1 % cream Apply to affected area 2 times daily for 10 days 08/01/21   Trifan, Kermit Balo, MD  ?HYDROcodone-acetaminophen (NORCO) 5-325 MG tablet Take 1 tablet by mouth every 6 (six) hours as needed for moderate pain. 03/19/16   Bjorn Pippin, MD  ?ibuprofen (ADVIL,MOTRIN) 200 MG tablet Take 400 mg by mouth every 6 (six) hours as needed. For pain    [provider]  ?   ? ?Allergies    ?Patient has no known allergies.    ? ?Review of Systems   ?Review of Systems  ?Constitutional:  Negative for chills and fever.  ?Respiratory:  Negative for cough and shortness of breath.   ?Cardiovascular:  Negative for chest pain.  ?Gastrointestinal:  Negative for nausea and vomiting.  ?Genitourinary:  Positive for flank pain. Negative for dysuria, frequency, penile discharge, penile pain and testicular pain.  ?Musculoskeletal:  Negative for back pain.  ?All other systems reviewed and are negative. ? ?Physical Exam ?Updated Vital Signs ?BP (!) 135/109 (BP Location: Right Arm)   Pulse 63   Temp 98.2 ?F (36.8 ?C) (Oral)   Resp 16   Ht 6\' 1"  (1.854 m)   Wt 77.1 kg   SpO2 100%   BMI 22.43 kg/m?  ?Physical Exam ?Vitals and nursing note reviewed.  ?Constitutional:   ?   Appearance: He is not ill-appearing.  ?HENT:  ?   Head: Normocephalic and atraumatic.  ?Eyes:  ?   Conjunctiva/sclera: Conjunctivae normal.  ?Cardiovascular:  ?   Rate and Rhythm: Normal rate and regular rhythm.  ?   Pulses: Normal pulses.  ?Pulmonary:  ?   Effort: Pulmonary effort is normal.  ?   Breath sounds: Normal breath sounds. No wheezing, rhonchi or rales.  ?Abdominal:  ?   Palpations: Abdomen is soft.  ?   Tenderness: There is abdominal tenderness. There is left CVA tenderness. There is no guarding or rebound.  ?  Comments: Soft, + left CVA and mild LLQ abd TTP, +BS throughout, no r/g/r, neg murphy's, neg mcburney's  ?Musculoskeletal:  ?   Cervical back: Neck supple.  ?   Comments: No C, T, or L midline spinal TTP  ?Skin: ?   General: Skin is warm and dry.  ?Neurological:  ?   Mental Status: He is alert.  ? ? ?ED Results / Procedures / Treatments   ?Labs ?(all labs ordered are listed, but only abnormal results are displayed) ?Labs Reviewed  ?URINALYSIS, ROUTINE W REFLEX MICROSCOPIC  ? ? ?EKG ?None ? ?Radiology ?DG Chest 2 View ? ?Result Date: 09/24/2021 ?CLINICAL DATA:  Pleuritic pain with smoking. EXAM: CHEST - 2 VIEW COMPARISON:  Chest radiographs 09/02/2019 FINDINGS:  The cardiomediastinal silhouette is within normal limits. The lungs are well inflated and clear. There is no evidence of pleural effusion or pneumothorax. No acute osseous abnormality is identified. IMPRESSION: No active cardiopulmonary disease. Electronically Signed   By: Sebastian Ache M.D.   On: 09/24/2021 13:48   ? ?Procedures ?Procedures  ? ? ?Medications Ordered in ED ?Medications - No data to display ? ?ED Course/ Medical Decision Making/ A&P ?  ?                        ?Medical Decision Making ?26 year old male who presents to the ED today with complaint of atraumatic left flank pain that began 1.5 weeks ago.  On arrival to the ED today vitals are stable.  Patient is afebrile, nontachycardic and nontachypneic.  He denies any other symptoms including nausea, vomiting, abdominal pain, urinary symptoms.  His work-up was started in triage including urinalysis and x-ray of his chest.  It does appear that he indicated that he smokes marijuana and sometimes has pleuritic type pain after smoking however states pain he is currently experiencing is different and not pleuritic in nature. No complaint of SOB. Exam does not seem consistent with PE at this time.  ? ?CXR negative for acute findings at this time. Will await U/A results with questionable kidney stone causing pain. If U/A negative without signs of blood in urine will plan to discharge home with ibuprofen/tylenol PRN for pain and PCP follow up.  ? ?U/A negative.  ? ?Pt to be discharged home at this time. Suspect muscular pain. Recommend Ibuprofen/Tylenol PRN for pain and warm compresses with PCP follow up. He is in agreement with plan and stable for discharge home.  ? ? ? ? ? ? ? ? ? ?Final Clinical Impression(s) / ED Diagnoses ?Final diagnoses:  ?Left flank pain  ? ? ?Rx / DC Orders ?ED Discharge Orders   ? ? None  ? ?  ? ? ? ?Discharge Instructions   ? ?  ?Please take Ibuprofen and Tylenol as needed for pain. Apply heat/warm compresses as needed.  ? ?Follow up  with your PCP. If you do not have one you can follow up with [facility] for primary care needs ? ?Return to the ED for any new/worsening symptoms ? ? ? ? ?  ?Tanda Rockers, PA-C ?09/24/21 1540 ? ?  ?Terrilee Files, MD ?09/24/21 1820 ? ?

## 2022-01-15 ENCOUNTER — Emergency Department (HOSPITAL_COMMUNITY)
Admission: EM | Admit: 2022-01-15 | Discharge: 2022-01-15 | Disposition: A | Discharge status: Home / Self Care | Payer: Self-pay | Attending: Emergency Medicine | Admitting: Emergency Medicine

## 2022-01-15 ENCOUNTER — Emergency Department (HOSPITAL_COMMUNITY): Payer: Self-pay

## 2022-01-15 ENCOUNTER — Other Ambulatory Visit: Payer: Self-pay

## 2022-01-15 DIAGNOSIS — R519 Headache, unspecified: Secondary | ICD-10-CM

## 2022-01-15 DIAGNOSIS — R109 Unspecified abdominal pain: Secondary | ICD-10-CM | POA: Insufficient documentation

## 2022-01-15 DIAGNOSIS — M545 Low back pain, unspecified: Secondary | ICD-10-CM

## 2022-01-15 DIAGNOSIS — Z20822 Contact with and (suspected) exposure to covid-19: Secondary | ICD-10-CM | POA: Insufficient documentation

## 2022-01-15 DIAGNOSIS — N50812 Left testicular pain: Secondary | ICD-10-CM | POA: Insufficient documentation

## 2022-01-15 DIAGNOSIS — R42 Dizziness and giddiness: Secondary | ICD-10-CM

## 2022-01-15 DIAGNOSIS — N50819 Testicular pain, unspecified: Secondary | ICD-10-CM

## 2022-01-15 LAB — COMPREHENSIVE METABOLIC PANEL
ALT: 38 U/L (ref 0–44)
AST: 27 U/L (ref 15–41)
Albumin: 4.4 g/dL (ref 3.5–5.0)
Alkaline Phosphatase: 42 U/L (ref 38–126)
Anion gap: 6 (ref 5–15)
BUN: 12 mg/dL (ref 6–20)
CO2: 27 mmol/L (ref 22–32)
Calcium: 9.4 mg/dL (ref 8.9–10.3)
Chloride: 105 mmol/L (ref 98–111)
Creatinine, Ser: 1.23 mg/dL (ref 0.61–1.24)
GFR, Estimated: >60 mL/min (ref >60)
Glucose, Bld: 91 mg/dL (ref 70–99)
Potassium: 4.4 mmol/L (ref 3.5–5.1)
Sodium: 138 mmol/L (ref 135–145)
Total Bilirubin: 0.7 mg/dL (ref 0.3–1.2)
Total Protein: 7.2 g/dL (ref 6.5–8.1)

## 2022-01-15 LAB — CBC WITH DIFFERENTIAL/PLATELET
Abs Immature Granulocytes: 0.01 10*3/uL (ref 0.00–0.07)
Basophils Absolute: 0 10*3/uL (ref 0.0–0.1)
Basophils Relative: 1 %
Eosinophils Absolute: 0.1 10*3/uL (ref 0.0–0.5)
Eosinophils Relative: 1 %
HCT: 46.3 % (ref 39.0–52.0)
Hemoglobin: 15.8 g/dL (ref 13.0–17.0)
Immature Granulocytes: 0 %
Lymphocytes Relative: 25 %
Lymphs Abs: 1.6 10*3/uL (ref 0.7–4.0)
MCH: 30.7 pg (ref 26.0–34.0)
MCHC: 34.1 g/dL (ref 30.0–36.0)
MCV: 89.9 fL (ref 80.0–100.0)
Monocytes Absolute: 0.4 10*3/uL (ref 0.1–1.0)
Monocytes Relative: 6 %
Neutro Abs: 4.4 10*3/uL (ref 1.7–7.7)
Neutrophils Relative %: 67 %
Platelets: 194 10*3/uL (ref 150–400)
RBC: 5.15 MIL/uL (ref 4.22–5.81)
RDW: 11.9 % (ref 11.5–15.5)
WBC: 6.5 10*3/uL (ref 4.0–10.5)
nRBC: 0 % (ref 0.0–0.2)

## 2022-01-15 LAB — TSH: TSH: 3.108 u[IU]/mL (ref 0.350–4.500)

## 2022-01-15 LAB — SARS CORONAVIRUS 2 BY RT PCR: SARS Coronavirus 2 by RT PCR: NEGATIVE

## 2022-01-15 MED ORDER — ACETAMINOPHEN 325 MG PO TABS
650.0000 mg | ORAL_TABLET | Freq: Once | ORAL | Status: AC
  Administered 2022-01-15: 650 mg via ORAL
  Filled 2022-01-15: qty 2

## 2022-01-15 MED ORDER — MECLIZINE HCL 25 MG PO TABS: 25.0000 mg | ORAL_TABLET | Freq: Three times a day (TID) | ORAL | 0 refills | Status: AC | PRN

## 2022-01-15 NOTE — ED Notes (Signed)
Pt verbalized understanding of d/c instructions, meds, and followup care. Denies questions. VSS, no distress noted. Steady gait to exit with all belongings.  ?

## 2022-01-15 NOTE — ED Provider Notes (Signed)
[facility] EMERGENC[facility] DEPARTMENT Provider Note   CSN: [id number] Arrival date & time: 01/15/22  1224     History  Chief Complaint  Patient presents with   Dizziness   Back Pain    Jeremy Adkins is a 26 y.o. male with no significant past medical history who presents emergency department with multiple complaints.  First he complains of recurrent left flank pain.  Pain is worse with movement bending and twisting.  He was seen for the same several months ago.  At that time he had pain that radiated into his right left anterior thigh however he does not have this at this time.  He denies any previous injuries, weakness, bowel or bladder incontinence.  Pain is improved with Tylenol.  Patient also complains of pain in his left testicle.  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  Patient states that that side is much smaller than the other side and is uncomfortable when he wears compression briefs.  He has no significant pain at this time and denies any urinary symptoms or penile discharge. Patient is also complaining of a frontal headache.  He points to the area between his eyebrows.  His mother is at bedside states "right above his pituitary gland."  He states that he never gets headaches and this is new today but he has been feeling "out of it and fatigued for the past 3 days."  His mother denies that he has acted confused in any way.  He denies nausea or vomiting.  He denies neck stiffness fever or head injury.   Dizziness Back Pain      Home Medications Prior to Admission medications   Medication Sig Start Date End Date Taking? Authorizing Provider  bacitracin ointment Apply 1 application topically 3 (three) times daily. 12/16/16   Waynetta Pean, PA-C  calcium carbonate (TUMS - DOSED IN MG ELEMENTAL CALCIUM) 500 MG chewable tablet Chew 2 tablets by mouth 3 (three) times daily as needed for indigestion.     [provider]   clotrimazole (LOTRIMIN) 1 % cream Apply to affected area 2 times daily for 10 days 08/01/21   Trifan, Carola Rhine, MD  HYDROcodone-acetaminophen (NORCO) 5-325 MG tablet Take 1 tablet by mouth every 6 (six) hours as needed for moderate pain. 03/19/16   Irine Seal, MD  ibuprofen (ADVIL,MOTRIN) 200 MG tablet Take 400 mg by mouth every 6 (six) hours as needed. For pain    [provider]      Allergies    Patient has no known allergies.    Review of Systems   Review of Systems  Musculoskeletal:  Positive for back pain.  Neurological:  Positive for dizziness.    Physical Exam Updated Vital Signs BP (!) 146/80   Pulse 62   Temp 99 F (37.2 C) (Oral)   Resp 16   SpO2 99%  Physical Exam Vitals and nursing note reviewed.  Constitutional:      General: He is not in acute distress.    Appearance: He is well-developed. He is not diaphoretic.  HENT:     Head: Normocephalic and atraumatic.     Nose: Nose normal.     Mouth/Throat:     Mouth: Mucous membranes are moist.  Eyes:     General: No scleral icterus.    Conjunctiva/sclera: Conjunctivae normal.     Pupils: Pupils are equal, round, and reactive to light.     Comments: No horizontal, vertical or  rotational nystagmus  Neck:     Comments: Full active and passive ROM without pain No midline or paraspinal tenderness No nuchal rigidity or meningeal signs Cardiovascular:     Rate and Rhythm: Normal rate and regular rhythm.  Pulmonary:     Effort: Pulmonary effort is normal. No respiratory distress.     Breath sounds: No wheezing or rales.  Abdominal:     General: There is no distension.     Palpations: Abdomen is soft.     Tenderness: There is no abdominal tenderness. There is no guarding or rebound.  Genitourinary:    Comments: Exam chaperoned by Ashley Mariner Right testicle full, nontender.  Left testicle puny and significantly smaller without significant pain or swelling.  Normal cremaster reflex bilaterally,  otherwise normal circumcised male anatomy. Musculoskeletal:        General: Normal range of motion.     Cervical back: Normal range of motion and neck supple.  Lymphadenopathy:     Cervical: No cervical adenopathy.  Skin:    General: Skin is warm and dry.     Findings: No rash.  Neurological:     Mental Status: He is alert and oriented to person, place, and time.     Cranial Nerves: No cranial nerve deficit.     Motor: No abnormal muscle tone.     Coordination: Coordination normal.     Comments: Mental Status:  Alert, oriented, thought content appropriate. Speech fluent without evidence of aphasia. Able to follow 2 step commands without difficulty.  Cranial Nerves:  II:  Peripheral visual fields grossly normal, pupils equal, round, reactive to light III,IV, VI: ptosis not present, extra-ocular motions intact bilaterally  V,VII: smile symmetric, facial light touch sensation equal VIII: hearing grossly normal bilaterally  IX,X: midline uvula rise  XI: bilateral shoulder shrug equal and strong XII: midline tongue extension  Motor:  5/5 in upper and lower extremities bilaterally including strong and equal grip strength and dorsiflexion/plantar flexion Sensory: Pinprick and light touch normal in all extremities.  Cerebellar: normal finger-to-nose with bilateral upper extremities Gait: normal gait and balance CV: distal pulses palpable throughout   Psychiatric:        Behavior: Behavior normal.        Thought Content: Thought content normal.        Judgment: Judgment normal.     ED Results / Procedures / Treatments   Labs (all labs ordered are listed, but only abnormal results are displayed) Labs Reviewed  SARS CORONAVIRUS 2 BY RT PCR  CBC WITH DIFFERENTIAL/PLATELET  COMPREHENSIVE METABOLIC PANEL  TSH    EKG None  Radiology No results found.  Procedures Procedures    Medications Ordered in ED Medications - No data to display  ED Course/ Medical Decision Making/  A&P                           Medical Decision Making 26 year old male with multiple complaints. Regarding his chronic test testicular discomfort and diminutive testicle I suspect this is sequela of his torsion 5 years ago.  I have ordered a Doppler scrotal ultrasound which confirms this.  He has normal blood flow at this time.  No evidence of epididymitis or orchitis. Patient also having back pain without red flag symptoms.  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 I have discussed home and supportive care for this.  Patient states that he is also feeling a bit abnormal with a headache that is new.  I have ordered and reviewed labs including thyroid stimulating heart and CBC CMP all of which are within normal limits.  He has a negative COVID test.  I also ordered visualized and interpreted both scrotal ultrasound and head CT both of which showed no acute abnormalities.  Hayes Ludwig presents with headache Given the large differential diagnosis for Hayes Ludwig, the decision making in this case is of high complexity.  After evaluating all of the data points in this case, the presentation of Udell A Breck is NOT consistent with skull fracture, meningitis/encephalitis, SAH/sentinel bleed, Intracranial Hemorrhage (ICH) (subdural/epidural), acute obstructive hydrocephalus, space occupying lesions, CVA, CO Poisoning, Basilar/vertebral artery dissection, preeclampsia, cerebral venous thrombosis, hypertensive emergency, temporal Arteritis, Idiopathic Intracranial Hypertension (pseudotumor cerebri).  Strict return and follow-up precautions have been given by me personally or by detailed written instructions verbalized by nursing staff using the teach back method to patient/family/caregiver.  Data Reviewed/Counseling: I have reviewed the patient's vital signs, nursing notes, and other relevant  tests/information. I had a detailed discussion regarding the historical points, exam findings, and any diagnostic results supporting the discharge diagnosis. I also discussed the need for outpatient follow-up and the need to return to the ED if symptoms worsen or if there are any questions or concerns that arise at hom    Amount and/or Complexity of Data Reviewed Labs: ordered. Radiology: ordered.  Risk OTC drugs.           Final Clinical Impression(s) / ED Diagnoses Final diagnoses:  Lightheadedness  Moderate headache  Acute left-sided low back pain without sciatica  Testicle tenderness    Rx / DC Orders ED Discharge Orders     None         Arthor Captain, PA-C 01/16/22 0002    Franne Forts, DO 01/22/22 1519

## 2022-01-15 NOTE — ED Triage Notes (Signed)
Pt reports lower back pain without injury, intermittent dizziness x 1 week, and one testicle being smaller than the other.

## 2022-01-15 NOTE — ED Provider Triage Note (Signed)
Emergency Medicine Provider Triage Evaluation Note  Jeremy Adkins , a 26 y.o. male  was evaluated in triage.  Pt complains of dizziness x 1 week. Lower back pain on the left side and also testicular pain, prior sx to his testicle. No Mat. No medical history, thought likely related to the heat but no improvement.   Review of Systems  Positive: Dizziness, low back pain Negative: Testicular pain, discharge, headache, cp  Physical Exam  BP (!) 115/105 (BP Location: Right Arm)   Pulse (!) 57   Temp 99.1 F (37.3 C) (Oral)   Resp 18   SpO2 95%  Gen:   Awake, no distress   Resp:  Normal effort  MSK:   Moves extremities without difficulty  Other:    Medical Decision Making  Medically screening exam initiated at 12:33 PM.  Appropriate orders placed.  Jeremy Adkins was informed that the remainder of the evaluation will be completed by another provider, this initial triage assessment does not replace that evaluation, and the importance of remaining in the ED until their evaluation is complete.     Claude Manges, PA-C 01/15/22 1237

## 2022-01-15 NOTE — Discharge Instructions (Addendum)
CT imaging your imaging was completely negative for abnormality today. You may follow-up with [facility] regarding your left testicle discomfort.  Your ultrasound showed a diminutive testicle likely due to the torsion you had in 2017 however there is adequate blood flow to that region. There is no evidence of pituitary mass and your physical examination showed muscular low back pain. You may look up low back strengthening and stretching exercises for the quadratus lumborum muscle specifically.  There is a lot of information on the Internet that can be helpful with this.  Treat please try to stick to videos and webpages that are specifically through physical therapy forearms.   SEEK MEDICAL ATTENTION IF: You develop possible problems with medications prescribed.  The medications don't resolve your headache, if it recurs , or if you have multiple episodes of vomiting or can't take fluids. You have a change from the usual headache. RETURN IMMEDIATELY IF you develop a sudden, severe headache or confusion, become poorly responsive or faint, develop a fever above 100.55F or problem breathing, have a change in speech, vision, swallowing, or understanding, or develop new weakness, numbness, tingling, incoordination, or have a seizure.

## 2022-07-17 ENCOUNTER — Other Ambulatory Visit: Payer: Self-pay

## 2022-07-17 ENCOUNTER — Encounter (HOSPITAL_BASED_OUTPATIENT_CLINIC_OR_DEPARTMENT_OTHER): Payer: Self-pay | Admitting: Emergency Medicine

## 2022-07-17 ENCOUNTER — Emergency Department (HOSPITAL_BASED_OUTPATIENT_CLINIC_OR_DEPARTMENT_OTHER)
Admission: EM | Admit: 2022-07-17 | Discharge: 2022-07-17 | Disposition: A | Discharge status: Home / Self Care | Payer: Self-pay | Attending: Emergency Medicine | Admitting: Emergency Medicine

## 2022-07-17 DIAGNOSIS — J029 Acute pharyngitis, unspecified: Secondary | ICD-10-CM | POA: Insufficient documentation

## 2022-07-17 DIAGNOSIS — Z1152 Encounter for screening for COVID-19: Secondary | ICD-10-CM | POA: Insufficient documentation

## 2022-07-17 LAB — RESP PANEL BY RT-PCR (RSV, FLU A&B, COVID)  RVPGX2
Influenza A by PCR: NEGATIVE
Influenza B by PCR: NEGATIVE
Resp Syncytial Virus by PCR: NEGATIVE
SARS Coronavirus 2 by RT PCR: NEGATIVE

## 2022-07-17 LAB — GROUP A STREP BY PCR: Group A Strep by PCR: NOT DETECTED

## 2022-07-17 MED ORDER — LIDOCAINE VISCOUS HCL 2 % MT SOLN: 15.0000 mL | OROMUCOSAL | 0 refills | Status: AC | PRN

## 2022-07-17 MED ORDER — LIDOCAINE VISCOUS HCL 2 % MT SOLN
15.0000 mL | Freq: Once | OROMUCOSAL | Status: AC
  Administered 2022-07-17: 15 mL via OROMUCOSAL
  Filled 2022-07-17: qty 15

## 2022-07-17 MED ORDER — DEXAMETHASONE 4 MG PO TABS
10.0000 mg | ORAL_TABLET | Freq: Once | ORAL | Status: AC
  Administered 2022-07-17: 10 mg via ORAL
  Filled 2022-07-17: qty 3

## 2022-07-17 MED ORDER — PENICILLIN G BENZATHINE 1200000 UNIT/2ML IM SUSY
1.2000 10*6.[IU] | PREFILLED_SYRINGE | Freq: Once | INTRAMUSCULAR | Status: AC
  Administered 2022-07-17: 1.2 10*6.[IU] via INTRAMUSCULAR
  Filled 2022-07-17: qty 2

## 2022-07-17 NOTE — Discharge Instructions (Signed)
You were seen in the ER today for evaluation of your sore throat. You tested negative for COVID, flu, RSV, and strep. This is possibly viral, but since you have had this for ten days, will treat you with an antibiotic, steroid, and send you home with some viscous lidocaine. Please make sure you are staying well hydrated and keeping your throat well lubricated with cough drops, hard candies, etc. Please follow up with your PCP. If you have any concerns, new or worsening symptoms, please return to the nearest ER for re-evaluation.   Contact a doctor if: You have large, tender lumps in your neck. You have a rash. You cough up green, yellow-brown, or bloody spit. Get help right away if: You have a stiff neck. You drool or cannot swallow liquids. You cannot drink or take medicines without vomiting. You have very bad pain that does not go away with medicine. You have problems breathing, and it is not from a stuffy nose. You have new pain and swelling in your knees, ankles, wrists, or elbows. These symptoms may be an emergency. Get help right away. Call your local emergency services (911 in the [place] Do not wait to see if the symptoms will go away. Do not drive yourself to the hospital.

## 2022-07-17 NOTE — ED Triage Notes (Signed)
Pt w/ sore throat x 1 wk
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
## 2022-07-17 NOTE — ED Notes (Signed)
Pt discharged to home. Discharge instructions have been discussed with patient and/or family members. Pt verbally acknowledges understanding d/c instructions, and endorses comprehension to checkout at registration before leaving.  °

## 2022-09-02 ENCOUNTER — Other Ambulatory Visit: Payer: Self-pay

## 2022-09-02 ENCOUNTER — Emergency Department (HOSPITAL_BASED_OUTPATIENT_CLINIC_OR_DEPARTMENT_OTHER)
Admission: EM | Admit: 2022-09-02 | Discharge: 2022-09-02 | Disposition: A | Discharge status: Home / Self Care | Payer: Self-pay | Attending: Emergency Medicine | Admitting: Emergency Medicine

## 2022-09-02 DIAGNOSIS — J029 Acute pharyngitis, unspecified: Secondary | ICD-10-CM | POA: Insufficient documentation

## 2022-09-02 DIAGNOSIS — Z87891 Personal history of nicotine dependence: Secondary | ICD-10-CM | POA: Insufficient documentation

## 2022-09-02 MED ORDER — LIDOCAINE VISCOUS HCL 2 % MT SOLN
15.0000 mL | Freq: Once | OROMUCOSAL | Status: AC
  Administered 2022-09-02: 15 mL via OROMUCOSAL
  Filled 2022-09-02: qty 15

## 2022-09-02 MED ORDER — VALACYCLOVIR HCL 1 G PO TABS: 1000.0000 mg | ORAL_TABLET | Freq: Three times a day (TID) | ORAL | 0 refills | Status: AC

## 2022-09-02 NOTE — ED Triage Notes (Signed)
Patient presents to ED via POV from home. Here with white patches to the back of his throat. Voices concern for STDs.

## 2022-09-02 NOTE — ED Provider Notes (Signed)
[place] [facility] AT [facility] Provider Note   CSN: [id number] Arrival date & time: 09/02/22  1056     History Chief Complaint  Patient presents with   Sore Throat    Jeremy Adkins is a 27 y.o. male patient who presents to the emergency department today with sore throat that is been ongoing for the last month.  Patient was seen and evaluated here on 07/17/2022 for similar symptoms.  He states that he kissed a girl and shortly after started noticing white bumps in the back of his throat as well as some flesh-colored ones.  He was googling and is concerned for herpes.  He received Bicillin and Decadron for strep throat at that time.  He states his symptoms improved slightly but is now come back and worsen.  He is able to swallow without difficulty, talk without difficulty, and without difficulty.  He states he does have a intermittent burning sensation sometimes when he is outdoors.  He denies any fever, chills, shortness of breath, chest pain, cough.  Patient does mention that he used to smoke daily but has stopped for 3 weeks with little improvement.  Sore Throat       Home Medications Prior to Admission medications   Medication Sig Start Date End Date Taking? Authorizing Provider  valACYclovir (VALTREX) 1000 MG tablet Take 1 tablet (1,000 mg total) by mouth 3 (three) times daily. 09/02/22  Yes Meredeth Ide, Fernando Torry M, PA-C  bacitracin ointment Apply 1 application topically 3 (three) times daily. 12/16/16   Everlene Farrier, PA-C  calcium carbonate (TUMS - DOSED IN MG ELEMENTAL CALCIUM) 500 MG chewable tablet Chew 2 tablets by mouth 3 (three) times daily as needed for indigestion.     [provider]  clotrimazole (LOTRIMIN) 1 % cream Apply to affected area 2 times daily for 10 days 08/01/21   Trifan, Kermit Balo, MD  HYDROcodone-acetaminophen (NORCO) 5-325 MG tablet Take 1 tablet by mouth every 6 (six) hours as needed for moderate pain. 03/19/16   Bjorn Pippin, MD   ibuprofen (ADVIL,MOTRIN) 200 MG tablet Take 400 mg by mouth every 6 (six) hours as needed. For pain    [provider]  lidocaine (XYLOCAINE) 2 % solution Use as directed 15 mLs in the mouth or throat as needed for mouth pain. 07/17/22   Achille Rich, PA-C  meclizine (ANTIVERT) 25 MG tablet Take 1 tablet (25 mg total) by mouth 3 (three) times daily as needed for dizziness. 01/15/22   Arthor Captain, PA-C      Allergies    Patient has no known allergies.    Review of Systems   Review of Systems  All other systems reviewed and are negative.   Physical Exam Updated Vital Signs BP (!) 158/89   Pulse 74   Temp 97.7 F (36.5 C) (Oral)   Resp 17   Ht  (1.854 m)   Wt 86.2 kg   SpO2 97%   BMI 25.07 kg/m  Physical Exam Vitals and nursing note reviewed.  Constitutional:      Appearance: Normal appearance.  HENT:     Head: Normocephalic and atraumatic.     Mouth/Throat:     Comments: Tonsils are normal.  Uvula is midline and normal.  No swelling of the oral floor.  Tongue is normal.  Dentition is normal.  There are some scant amount of flesh-colored papules on the posterior pharynx. Eyes:     General:  Right eye: No discharge.        Left eye: No discharge.     Conjunctiva/sclera: Conjunctivae normal.  Pulmonary:     Effort: Pulmonary effort is normal.  Skin:    General: Skin is warm and dry.     Findings: No rash.  Neurological:     General: No focal deficit present.     Mental Status: He is alert.  Psychiatric:        Mood and Affect: Mood normal.        Behavior: Behavior normal.     ED Results / Procedures / Treatments   Labs (all labs ordered are listed, but only abnormal results are displayed) Labs Reviewed - No data to display  EKG None  Radiology No results found.  Procedures Procedures    Medications Ordered in ED Medications  lidocaine (XYLOCAINE) 2 % viscous mouth solution 15 mL (has no administration in time range)    ED  Course/ Medical Decision Making/ A&P   {   Click here for ABCD2, HEART and other calculators  Medical Decision Making Jeremy Adkins is a 27 y.o. male patient who presents to the emergency department today for further evaluation of sore throat.  I have a low indication for PTA, RPA, or any other deep-seated infection today.  I have a low suspicion for strep throat and mono.  I also do not feel strongly that this is herpes and there is no consolidated clusters.  However, patient is concerned for herpes and would like to be treated for herpes.  I will plan to give him some Valtrex in addition to Xylocaine to help with the interim pain.  I will have him follow-up with his primary care doctor for further evaluation.  He states that he recently had an STD test which she tested negative for every STD apart from herpes.  Strict return precaution were discussed.  He is safe for discharge.    Final Clinical Impression(s) / ED Diagnoses Final diagnoses:  Pharyngitis, unspecified etiology    Rx / DC Orders ED Discharge Orders          Ordered    valACYclovir (VALTREX) 1000 MG tablet  3 times daily        09/02/22 1149              Honor Loh [place], [place] 09/02/22 1151    Terald Sleeper, MD 09/02/22 1544

## 2022-09-02 NOTE — Discharge Instructions (Addendum)
As we discussed, I am giving you a medication to treat for herpes although I do not feel that this is necessarily herpes this will be a good trial.  Would like for you to follow-up with your primary care doctor if you have 1.  Use the Xylocaine mouth solution as needed for pain.  You may return to the emergency department for any worsening symptoms.

## 2023-11-12 ENCOUNTER — Encounter (HOSPITAL_COMMUNITY): Payer: Self-pay | Admitting: *Deleted

## 2023-11-12 ENCOUNTER — Other Ambulatory Visit: Payer: Self-pay

## 2023-11-12 ENCOUNTER — Emergency Department (HOSPITAL_COMMUNITY)

## 2023-11-12 ENCOUNTER — Emergency Department (HOSPITAL_COMMUNITY)
Admission: EM | Admit: 2023-11-12 | Discharge: 2023-11-12 | Discharge status: Against Medical Advice | Attending: Emergency Medicine | Admitting: Emergency Medicine

## 2023-11-12 DIAGNOSIS — Y93E5 Activity, floor mopping and cleaning: Secondary | ICD-10-CM | POA: Insufficient documentation

## 2023-11-12 DIAGNOSIS — Z23 Encounter for immunization: Secondary | ICD-10-CM | POA: Insufficient documentation

## 2023-11-12 DIAGNOSIS — W25XXXA Contact with sharp glass, initial encounter: Secondary | ICD-10-CM | POA: Diagnosis not present

## 2023-11-12 DIAGNOSIS — Z5321 Procedure and treatment not carried out due to patient leaving prior to being seen by health care provider: Secondary | ICD-10-CM | POA: Diagnosis not present

## 2023-11-12 DIAGNOSIS — S61215A Laceration without foreign body of left ring finger without damage to nail, initial encounter: Secondary | ICD-10-CM | POA: Diagnosis present

## 2023-11-12 MED ORDER — TETANUS-DIPHTH-ACELL PERTUSSIS 5-2.5-18.5 LF-MCG/0.5 IM SUSY
0.5000 mL | PREFILLED_SYRINGE | Freq: Once | INTRAMUSCULAR | Status: AC
  Administered 2023-11-12: 0.5 mL via INTRAMUSCULAR
  Filled 2023-11-12: qty 0.5

## 2023-11-12 NOTE — ED Triage Notes (Signed)
 The pt cut his fingers  middle and ring washing dishes  with a broken glass he was unaware that it was broken    approx 45 minutes ago  blood oozing on arrival  washed with water  to view the cuts  bleeding minimal at present  good finger movement

## 2023-11-12 NOTE — ED Provider Triage Note (Signed)
 Emergency Medicine Provider Triage Evaluation Note  Jeremy Adkins , a 28 y.o. male  was evaluated in triage.  Pt complains of laceration. Cut on a glass in the sink prior to arrival. Lacerations to the left dorsal middle and ring finger. Unsure last tetanus  Review of Systems  Positive:  Negative:   Physical Exam  BP (!) 131/94 (BP Location: Right Arm)   Pulse 74   Temp 98.6 F (37 C)   Resp 16   SpO2 98%  Gen:   Awake, no distress   Resp:  Normal effort  MSK:   Moves extremities without difficulty  Other:  Linear lacerations to the palmar aspect of the left middle and ring finger. ROM intact.  Good capillary refill, good distal sensation.  Bleeding controlled.  Medical Decision Making  Medically screening exam initiated at 6:38 PM.  Appropriate orders placed.  Jeremy Adkins was informed that the remainder of the evaluation will be completed by another provider, this initial triage assessment does not replace that evaluation, and the importance of remaining in the ED until their evaluation is complete.     Nora Lauraine DELENA DEVONNA 11/12/23 1840

## 2023-11-12 NOTE — ED Notes (Signed)
 Pt deciding to continue waiting at this time

## 2023-11-12 NOTE — ED Notes (Signed)
 Pt states he is leaving AMA due to wait

## 2023-11-12 NOTE — ED Notes (Signed)
 Pt called for vitals multiple times, no response

## 2023-11-12 NOTE — ED Notes (Signed)
Pt called for vitals, no response at this time 

## 2023-11-12 NOTE — ED Notes (Signed)
 Patient left AMA prior to room placement.  This RN did not see patient prior to his departure.

## 2023-11-13 ENCOUNTER — Encounter (HOSPITAL_COMMUNITY): Payer: Self-pay | Admitting: Emergency Medicine

## 2023-11-13 ENCOUNTER — Emergency Department (HOSPITAL_COMMUNITY)
Admission: EM | Admit: 2023-11-13 | Discharge: 2023-11-13 | Disposition: A | Discharge status: Home / Self Care | Attending: Emergency Medicine | Admitting: Emergency Medicine

## 2023-11-13 ENCOUNTER — Other Ambulatory Visit: Payer: Self-pay

## 2023-11-13 DIAGNOSIS — W25XXXA Contact with sharp glass, initial encounter: Secondary | ICD-10-CM | POA: Diagnosis not present

## 2023-11-13 DIAGNOSIS — S61213A Laceration without foreign body of left middle finger without damage to nail, initial encounter: Secondary | ICD-10-CM | POA: Insufficient documentation

## 2023-11-13 DIAGNOSIS — S6992XA Unspecified injury of left wrist, hand and finger(s), initial encounter: Secondary | ICD-10-CM | POA: Diagnosis present

## 2023-11-13 NOTE — Discharge Instructions (Addendum)
 You had 5 stitches placed to your left finger, please have these removed within 7 to 10 days.  Please keep the wound covered when you go to work, uncovered when you are at home.  You may apply also some bacitracin  or Neosporin to the area.  If you experience any redness, drainage from the wound you will need to return to the emergency department.

## 2023-11-13 NOTE — ED Triage Notes (Signed)
 Patient arrives ambulatory by POV c/o left middle and ring finger laceration. Patient came last night but left due to wait time.

## 2023-11-13 NOTE — ED Provider Notes (Signed)
 [place] EM[facility]GENCY DEPARTMENT AT [facility] Provider Note   CSN: [id number] Arrival date & time: 11/13/23  1317     Patient presents with: Finger Injury (Laceration/)   Jeremy Adkins is a 28 y.o. male.   28 year old male with no past medical history presents to the ED with a chief complaint of left hand laceration which occurred last night.  Patient reports he was evaluated in the ED yesterday, however left due to a long wait.  He did cut his left ring, left long finger with glass.  X-ray yesterday did not show any foreign body noted.  He continues to have oozing from the wound.  Last tetanus immunization was updated yesterday.  No other complaints reported.  The history is provided by the patient.       Prior to Admission medications   Medication Sig Start Date End Date Taking? Authorizing Provider  bacitracin  ointment Apply 1 application topically 3 (three) times daily. 12/16/16   Dansie, William, PA-C  calcium carbonate (TUMS - DOSED IN MG ELEMENTAL CALCIUM) 500 MG chewable tablet Chew 2 tablets by mouth 3 (three) times daily as needed for indigestion.     [provider]  clotrimazole  (LOTRIMIN ) 1 % cream Apply to affected area 2 times daily for 10 days 08/01/21   Trifan, Donnice PARAS, MD  HYDROcodone -acetaminophen  (NORCO) 5-325 MG tablet Take 1 tablet by mouth every 6 (six) hours as needed for moderate pain. 03/19/16   Watt Rush, MD  ibuprofen  (ADVIL ,MOTRIN ) 200 MG tablet Take 400 mg by mouth every 6 (six) hours as needed. For pain    [provider]  lidocaine  (XYLOCAINE ) 2 % solution Use as directed 15 mLs in the mouth or throat as needed for mouth pain. 07/17/22   Bernis Ernst, PA-C  meclizine  (ANTIVERT ) 25 MG tablet Take 1 tablet (25 mg total) by mouth 3 (three) times daily as needed for dizziness. 01/15/22   Harris, Abigail, PA-C  valACYclovir  (VALTREX ) 1000 MG tablet Take 1 tablet (1,000 mg total) by mouth 3 (three) times daily. 09/02/22   Theotis Cameron HERO, PA-C    Allergies: Patient has no known allergies.    Review of Systems  Constitutional:  Negative for fever.  Skin:  Positive for wound.    Updated Vital Signs BP (!) 138/96   Pulse 77   Temp 98.5 F (36.9 C) (Oral)   Resp 16   Ht 6' 1 (1.854 m)   Wt 86.2 kg   SpO2 99%   BMI 25.07 kg/m   Physical Exam Vitals and nursing note reviewed.  Constitutional:      Appearance: Normal appearance.  HENT:     Head: Normocephalic and atraumatic.     Mouth/Throat:     Mouth: Mucous membranes are moist.   Cardiovascular:     Rate and Rhythm: Normal rate.  Pulmonary:     Effort: Pulmonary effort is normal.  Abdominal:     General: Abdomen is flat.   Musculoskeletal:     Left hand: Laceration present. Normal capillary refill. Normal pulse.     Cervical back: Normal range of motion and neck supple.     Comments: Small laceration noted without any active bleeding.   Skin:    General: Skin is warm and dry.   Neurological:     Mental Status: He is alert and oriented to person, place, and time.     (all labs ordered are listed, but only abnormal results are displayed) Labs Reviewed -  No data to display  EKG: None  Radiology: DG Hand Complete Left Result Date: 11/12/2023 CLINICAL DATA:  laceration EXAM: LEFT HAND - COMPLETE 3+ VIEW COMPARISON:  None Available. FINDINGS: No acute fracture or dislocation. Likely remote, posttraumatic deformity of the ulnar styloid. There is no evidence of arthropathy or other focal bone abnormality. Soft tissues are unremarkable. No radiopaque foreign body. IMPRESSION: No acute fracture or dislocation. No radiopaque foreign body. Electronically Signed   By: Rogelia Myers M.D.   On: 11/12/2023 19:06     .Laceration Repair  Date/Time: 11/13/2023 2:07 PM  Performed by: Laprecious Austill, PA-C Authorized by: Sai Moura, PA-C   Consent:    Consent obtained:  Verbal   Consent given by:  Patient   Risks discussed:  Infection and  pain Universal protocol:    Patient identity confirmed:  Verbally with patient Anesthesia:    Anesthesia method:  Local infiltration   Local anesthetic:  Lidocaine  1% w/o epi Laceration details:    Location:  Finger   Finger location:  L long finger   Length (cm):  4   Depth (mm):  0.5 Treatment:    Area cleansed with:  Saline   Amount of cleaning:  Standard Skin repair:    Repair method:  Sutures   Suture size:  4-0   Suture material:  Prolene   Suture technique:  Simple interrupted   Number of sutures:  5 Approximation:    Approximation:  Close Repair type:    Repair type:  Simple Post-procedure details:    Dressing:  Open (no dressing)   Procedure completion:  Tolerated well, no immediate complications    Medications Ordered in the ED - No data to display                                  Medical Decision Making  Patient presented to the ED for the second day in a row after a laceration which occurred last night.  Reports he came in yesterday but the wait was very long.  Did have his tetanus immunization updated yesterday.  Exam is benign today, remains neurovascularly intact.  No foreign body noted despite extensive irrigation of the wound.  I personally repaired this laceration with 5 stitches of 4-0 Prolene.  He tolerated procedure adequately, discharged from the triage area in stable condition.  Portions of this note were generated with Scientist, clinical (histocompatibility and immunogenetics). Dictation errors may occur despite best attempts at proofreading.      Final diagnoses:  Laceration of left middle finger without damage to nail, foreign body presence unspecified, initial encounter    ED Discharge Orders     None          Maureen Broad, PA-C 11/13/23 1410    Pamella Ozell LABOR, DO 11/16/23 2042
